# Patient Record
Sex: Female | Born: 1970 | Race: White | Hispanic: No | Marital: Single | Smoking: Never smoker
Health system: Southern US, Community
[De-identification: ages and names within clinical notes are randomized; demographics above are authoritative.]

## PROBLEM LIST (undated history)

## (undated) DIAGNOSIS — F329 Major depressive disorder, single episode, unspecified: Secondary | ICD-10-CM

## (undated) DIAGNOSIS — R112 Nausea with vomiting, unspecified: Secondary | ICD-10-CM

## (undated) DIAGNOSIS — K219 Gastro-esophageal reflux disease without esophagitis: Secondary | ICD-10-CM

## (undated) DIAGNOSIS — D649 Anemia, unspecified: Secondary | ICD-10-CM

## (undated) DIAGNOSIS — R51 Headache: Secondary | ICD-10-CM

## (undated) DIAGNOSIS — F419 Anxiety disorder, unspecified: Secondary | ICD-10-CM

## (undated) DIAGNOSIS — R519 Headache, unspecified: Secondary | ICD-10-CM

## (undated) DIAGNOSIS — Z8489 Family history of other specified conditions: Secondary | ICD-10-CM

## (undated) DIAGNOSIS — Z9889 Other specified postprocedural states: Secondary | ICD-10-CM

## (undated) DIAGNOSIS — C801 Malignant (primary) neoplasm, unspecified: Secondary | ICD-10-CM

## (undated) DIAGNOSIS — F32A Depression, unspecified: Secondary | ICD-10-CM

## (undated) DIAGNOSIS — R011 Cardiac murmur, unspecified: Secondary | ICD-10-CM

## (undated) DIAGNOSIS — N2889 Other specified disorders of kidney and ureter: Secondary | ICD-10-CM

## (undated) HISTORY — PX: GASTRIC ROUX-EN-Y: SHX5262

## (undated) HISTORY — DX: Gastro-esophageal reflux disease without esophagitis: K21.9

## (undated) HISTORY — PX: UVULECTOMY: SHX2631

## (undated) HISTORY — PX: OTHER SURGICAL HISTORY: SHX169

## (undated) HISTORY — PX: TONSILLECTOMY: SUR1361

## (undated) HISTORY — PX: CHOLECYSTECTOMY: SHX55

---

## 2015-12-25 ENCOUNTER — Telehealth: Payer: Self-pay | Admitting: Internal Medicine

## 2015-12-25 ENCOUNTER — Other Ambulatory Visit: Payer: Self-pay | Admitting: Urology

## 2015-12-25 NOTE — Telephone Encounter (Signed)
Received referral to schedule patient an appointment for Gastroparesis. Patient was recently seen by [facility], patient states that [facility] "wouldn't listen to her and blew her off". Patient states that she continues to have nausea, vomiting, and abd cramping. She is requesting to see Dr. Hilarie Fredrickson. [facility] records have been received and placed on Dr. Vena Rua desk for review.

## 2016-01-15 NOTE — Telephone Encounter (Signed)
Records are still being reviewed

## 2016-01-16 ENCOUNTER — Encounter (INDEPENDENT_AMBULATORY_CARE_PROVIDER_SITE_OTHER): Payer: Self-pay | Admitting: Internal Medicine

## 2016-01-28 ENCOUNTER — Ambulatory Visit (INDEPENDENT_AMBULATORY_CARE_PROVIDER_SITE_OTHER): Payer: BLUE CROSS/BLUE SHIELD | Admitting: Internal Medicine

## 2016-01-28 ENCOUNTER — Encounter (INDEPENDENT_AMBULATORY_CARE_PROVIDER_SITE_OTHER): Payer: Self-pay | Admitting: Internal Medicine

## 2016-01-28 VITALS — BP 128/70 | HR 72 | Temp 98.0°F | Ht 64.0 in | Wt 213.6 lb

## 2016-01-28 DIAGNOSIS — K3184 Gastroparesis: Secondary | ICD-10-CM | POA: Diagnosis not present

## 2016-01-28 DIAGNOSIS — K219 Gastro-esophageal reflux disease without esophagitis: Secondary | ICD-10-CM | POA: Insufficient documentation

## 2016-01-28 MED ORDER — OMEPRAZOLE 40 MG PO CPDR: 40.0000 mg | DELAYED_RELEASE_CAPSULE | Freq: Every day | ORAL | 3 refills | Status: DC

## 2016-01-28 NOTE — Patient Instructions (Addendum)
Continue the Reglan qid. Consider following up at [facility] Omeprazole 40mg  daily

## 2016-01-28 NOTE — Telephone Encounter (Signed)
Pt now being seen by [facility], and thus does not need an appt with [place] It appears they have recommended she return to [facility], but will defer this decision to [facility] and the patient No appointment needed with [facility] in light of this information

## 2016-01-28 NOTE — Progress Notes (Signed)
   Subjective:    Patient ID: Ellen Klein, female    DOB: [birth year], 45 y.o.   MRN: [id number]  HPI Referred by Alanda Amass NP-C for dysphagia. She tells me every since she had gastric bypass surgery one year ago she has had nausea about 2-3 times a weeks. She says she has nausea every time she put something in her mouth.  She has been followed by [facility]. She had an EGD in August and she says it was normal. She underwent a NM Gastric Emptying study.: Severely delayed gastric emptying. Half time could not be calculated however only (% of activity emptied from the stomach after 60 minutes. Gastric bypass by Dr Hampton Abbot in [place]. ' She is having surgery November 8 for a possible kidney cancer at [facility].  She has a BM once a week or twice a week. She has had multiple EGD's in the past with ablation for Barrett's (2016) ;10/30/2015 H and H 11.0 and 35.1   10/10/2015 Barium swallow: normal.  No significant abnormality identified.   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 Interval increase in size of right renal lesion which definitely appears to be enhancing and is highly suspicious for primary renal cell carcinoma. Recommend referral to urology. 2. Status post gastric bypass procedure. 3. Large amount of stool throughout the colon suggesting constipation without fecal impaction. 4. Dominant follicles or small cysts left ovary. Recommend follow-up ultrasound of the pelvis to document resolution. 5. Appendix not definitively identified but no inflammatory changes associated with the cecum. 6. Previous cholecystectomy. Dilatation common bile duct, see above.     Review of Systems Past Medical History:  Diagnosis Date  . GERD without esophagitis     Past Surgical History:  Procedure Laterality Date  . rt kidney mass      Allergies  Allergen Reactions  . Imdur [Isosorbide  Dinitrate]     Nausea and vomiting    No current outpatient prescriptions on file prior to visit.   No current facility-administered medications on file prior to visit.        Objective:   Physical Exam Blood pressure 128/70, pulse 72, temperature 98 F (36.7 C), height 5\' 4"  (1.626 m), weight 213 lb 9.6 oz (96.9 kg).   Alert and oriented. Skin warm and dry. Oral mucosa is moist.   . Sclera anicteric, conjunctivae is pink. Thyroid not enlarged. No cervical lymphadenopathy. Lungs clear. Heart regular rate and rhythm.  Abdomen is soft. Bowel sounds are positive. No hepatomegaly. No abdominal masses felt. No tenderness.  No edema to lower extremities. Patient is alert and oriented.      Assessment & Plan:  Nausea. Hx of gastroparesis. Eat 4 small meals a day. Start the Reglan back qid. Omeprazole 40mg  daily.   I think she needs to follow up at [facility].  Follow up pending.

## 2016-01-29 NOTE — Telephone Encounter (Signed)
Dr. Hilarie Fredrickson reviewed records and has declined to accept patient. Patient is aware of this.

## 2016-02-07 NOTE — Patient Instructions (Signed)
Ellen Klein  02/07/2016   Your procedure is scheduled on: 02-13-16  Report to [facility] Main  Entrance take [facility]  elevators to 3rd floor to  [place] at 630 AM.  Call this number if you have problems the morning of surgery [phone]   Remember: ONLY 1 PERSON MAY GO WITH YOU TO SHORT STAY TO GET  READY MORNING OF [place].  Do not eat food or drink liquids :After Midnight.  Follow all bowel prep and clear liquid instructions from dr Louis Meckel   Take these medicines the morning of surgery with A SIP OF WATER: NONE              You may not have any metal on your body including hair pins and              piercings  Do not wear jewelry, make-up, lotions, powders or perfumes, deodorant             Do not wear nail polish.  Do not shave  48 hours prior to surgery.              Men may shave face and neck.   Do not bring valuables to the hospital. [place].  Contacts, dentures or bridgework may not be worn into surgery.  Leave suitcase in the car. After surgery it may be brought to your room.                  Please read over the following fact sheets you were given: _____________________________________________________________________             [facility] - Preparing for Surgery Before surgery, you can play an important role.  Because skin is not sterile, your skin needs to be as free of germs as possible.  You can reduce the number of germs on your skin by washing with CHG (chlorahexidine gluconate) soap before surgery.  CHG is an antiseptic cleaner which kills germs and bonds with the skin to continue killing germs even after washing. Please DO NOT use if you have an allergy to CHG or antibacterial soaps.  If your skin becomes reddened/irritated stop using the CHG and inform your nurse when you arrive at Short Stay. Do not shave (including legs and underarms) for at least 48 hours prior to the  first CHG shower.  You may shave your face/neck. Please follow these instructions carefully:  1.  Shower with CHG Soap the night before surgery and the  morning of Surgery.  2.  If you choose to wash your hair, wash your hair first as usual with your  normal  shampoo.  3.  After you shampoo, rinse your hair and body thoroughly to remove the  shampoo.                           4.  Use CHG as you would any other liquid soap.  You can apply chg directly  to the skin and wash                       Gently with a scrungie or clean washcloth.  5.  Apply the CHG Soap to your body ONLY FROM THE NECK DOWN.  Do not use on face/ open                           Wound or open sores. Avoid contact with eyes, ears mouth and genitals (private parts).                       Wash face,  Genitals (private parts) with your normal soap.             6.  Wash thoroughly, paying special attention to the area where your surgery  will be performed.  7.  Thoroughly rinse your body with warm water from the neck down.  8.  DO NOT shower/wash with your normal soap after using and rinsing off  the CHG Soap.                9.  Pat yourself dry with a clean towel.            10.  Wear clean pajamas.            11.  Place clean sheets on your bed the night of your first shower and do not  sleep with pets. Day of Surgery : Do not apply any lotions/deodorants the morning of surgery.  Please wear clean clothes to the hospital/surgery center.  FAILURE TO FOLLOW THESE INSTRUCTIONS MAY RESULT IN THE CANCELLATION OF YOUR SURGERY PATIENT SIGNATURE_________________________________  NURSE SIGNATURE__________________________________  ________________________________________________________________________

## 2016-02-11 ENCOUNTER — Encounter (HOSPITAL_COMMUNITY)
Admission: RE | Admit: 2016-02-11 | Discharge: 2016-02-11 | Disposition: A | Discharge status: Home / Self Care | Payer: BLUE CROSS/BLUE SHIELD | Source: Ambulatory Visit | Attending: Urology | Admitting: Urology

## 2016-02-11 ENCOUNTER — Encounter (HOSPITAL_COMMUNITY): Payer: Self-pay | Admitting: *Deleted

## 2016-02-11 HISTORY — DX: Other specified disorders of kidney and ureter: N28.89

## 2016-02-11 HISTORY — DX: Headache, unspecified: R51.9

## 2016-02-11 HISTORY — DX: Other specified postprocedural states: Z98.890

## 2016-02-11 HISTORY — DX: Other specified postprocedural states: R11.2

## 2016-02-11 HISTORY — DX: Headache: R51

## 2016-02-11 LAB — CBC
HCT: 31.2 % — ABNORMAL LOW (ref 36.0–46.0)
Hemoglobin: 9.9 g/dL — ABNORMAL LOW (ref 12.0–15.0)
MCH: 26.3 pg (ref 26.0–34.0)
MCHC: 31.7 g/dL (ref 30.0–36.0)
MCV: 83 fL (ref 78.0–100.0)
PLATELETS: 171 10*3/uL (ref 150–400)
RBC: 3.76 MIL/uL — ABNORMAL LOW (ref 3.87–5.11)
RDW: 15.1 % (ref 11.5–15.5)
WBC: 7.4 10*3/uL (ref 4.0–10.5)

## 2016-02-11 LAB — BASIC METABOLIC PANEL
Anion gap: 6 (ref 5–15)
BUN: 15 mg/dL (ref 6–20)
CALCIUM: 8.2 mg/dL — AB (ref 8.9–10.3)
CHLORIDE: 110 mmol/L (ref 101–111)
CO2: 23 mmol/L (ref 22–32)
CREATININE: 0.88 mg/dL (ref 0.44–1.00)
GFR calc Af Amer: >60 mL/min (ref >60)
GFR calc non Af Amer: >60 mL/min (ref >60)
GLUCOSE: 147 mg/dL — AB (ref 65–99)
Potassium: 3.7 mmol/L (ref 3.5–5.1)
Sodium: 139 mmol/L (ref 135–145)

## 2016-02-11 LAB — PREGNANCY, URINE: Preg Test, Ur: NEGATIVE

## 2016-02-11 LAB — ABO/RH: ABO/RH(D): O POS

## 2016-02-12 LAB — URINE CULTURE

## 2016-02-13 ENCOUNTER — Inpatient Hospital Stay (HOSPITAL_COMMUNITY): Payer: BLUE CROSS/BLUE SHIELD | Admitting: Anesthesiology

## 2016-02-13 ENCOUNTER — Inpatient Hospital Stay (HOSPITAL_COMMUNITY)
Admission: RE | Admit: 2016-02-13 | Discharge: 2016-02-14 | DRG: 658 | Disposition: A | Discharge status: Home / Self Care | Payer: BLUE CROSS/BLUE SHIELD | Source: Ambulatory Visit | Attending: Urology | Admitting: Urology

## 2016-02-13 ENCOUNTER — Encounter (HOSPITAL_COMMUNITY): Payer: Self-pay | Admitting: Anesthesiology

## 2016-02-13 ENCOUNTER — Encounter (HOSPITAL_COMMUNITY)
Admission: RE | Disposition: A | Discharge status: Home / Self Care | Payer: Self-pay | Source: Ambulatory Visit | Attending: Urology

## 2016-02-13 DIAGNOSIS — Z888 Allergy status to other drugs, medicaments and biological substances status: Secondary | ICD-10-CM | POA: Diagnosis not present

## 2016-02-13 DIAGNOSIS — D4101 Neoplasm of uncertain behavior of right kidney: Principal | ICD-10-CM | POA: Diagnosis present

## 2016-02-13 DIAGNOSIS — Z6835 Body mass index (BMI) 35.0-35.9, adult: Secondary | ICD-10-CM | POA: Diagnosis not present

## 2016-02-13 DIAGNOSIS — N2889 Other specified disorders of kidney and ureter: Secondary | ICD-10-CM | POA: Diagnosis present

## 2016-02-13 DIAGNOSIS — Z9884 Bariatric surgery status: Secondary | ICD-10-CM

## 2016-02-13 DIAGNOSIS — K219 Gastro-esophageal reflux disease without esophagitis: Secondary | ICD-10-CM | POA: Diagnosis present

## 2016-02-13 DIAGNOSIS — E669 Obesity, unspecified: Secondary | ICD-10-CM | POA: Diagnosis present

## 2016-02-13 HISTORY — PX: ROBOTIC ASSITED PARTIAL NEPHRECTOMY: SHX6087

## 2016-02-13 LAB — HEMOGLOBIN AND HEMATOCRIT, BLOOD
HEMATOCRIT: 30.8 % — AB (ref 36.0–46.0)
HEMOGLOBIN: 10 g/dL — AB (ref 12.0–15.0)

## 2016-02-13 LAB — PREPARE RBC (CROSSMATCH)

## 2016-02-13 SURGERY — NEPHRECTOMY, PARTIAL, ROBOT-ASSISTED
Anesthesia: General | Laterality: Right

## 2016-02-13 MED ORDER — LACTATED RINGERS IV SOLN
INTRAVENOUS | Status: DC | PRN
  Administered 2016-02-13 (×2): via INTRAVENOUS

## 2016-02-13 MED ORDER — DEXTROSE-NACL 5-0.45 % IV SOLN
INTRAVENOUS | Status: DC
  Administered 2016-02-13 (×2): via INTRAVENOUS

## 2016-02-13 MED ORDER — TRAMADOL HCL 50 MG PO TABS: 50.0000 mg | ORAL_TABLET | Freq: Four times a day (QID) | ORAL | 0 refills | Status: DC | PRN

## 2016-02-13 MED ORDER — LIDOCAINE 2% (20 MG/ML) 5 ML SYRINGE
INTRAMUSCULAR | Status: AC
  Filled 2016-02-13: qty 5

## 2016-02-13 MED ORDER — SCOPOLAMINE 1 MG/3DAYS TD PT72
MEDICATED_PATCH | TRANSDERMAL | Status: DC | PRN
  Administered 2016-02-13: 1 via TRANSDERMAL

## 2016-02-13 MED ORDER — MIDAZOLAM HCL 2 MG/2ML IJ SOLN
INTRAMUSCULAR | Status: AC
  Filled 2016-02-13: qty 2

## 2016-02-13 MED ORDER — ROCURONIUM BROMIDE 50 MG/5ML IV SOSY
PREFILLED_SYRINGE | INTRAVENOUS | Status: AC
  Filled 2016-02-13: qty 10

## 2016-02-13 MED ORDER — CEFAZOLIN SODIUM-DEXTROSE 2-4 GM/100ML-% IV SOLN
INTRAVENOUS | Status: AC
  Filled 2016-02-13: qty 100

## 2016-02-13 MED ORDER — BUPIVACAINE LIPOSOME 1.3 % IJ SUSP
INTRAMUSCULAR | Status: DC | PRN
  Administered 2016-02-13: 20 mL

## 2016-02-13 MED ORDER — HYDROCODONE-ACETAMINOPHEN 5-325 MG PO TABS: 1.0000 | ORAL_TABLET | Freq: Four times a day (QID) | ORAL | 0 refills | Status: DC | PRN

## 2016-02-13 MED ORDER — FENTANYL CITRATE (PF) 100 MCG/2ML IJ SOLN
INTRAMUSCULAR | Status: DC | PRN
  Administered 2016-02-13: 50 ug via INTRAVENOUS
  Administered 2016-02-13: 100 ug via INTRAVENOUS
  Administered 2016-02-13 (×2): 50 ug via INTRAVENOUS

## 2016-02-13 MED ORDER — SODIUM CHLORIDE 0.9 % IJ SOLN
INTRAMUSCULAR | Status: AC
Start: 2016-02-13 — End: 2016-02-13
  Filled 2016-02-13: qty 50

## 2016-02-13 MED ORDER — HYDROMORPHONE HCL 1 MG/ML IJ SOLN
0.2500 mg | INTRAMUSCULAR | Status: DC | PRN
Start: 2016-02-13 — End: 2016-02-13
  Administered 2016-02-13: 0.5 mg via INTRAVENOUS

## 2016-02-13 MED ORDER — METOCLOPRAMIDE HCL 5 MG/ML IJ SOLN
INTRAMUSCULAR | Status: DC | PRN
  Administered 2016-02-13: 10 mg via INTRAVENOUS

## 2016-02-13 MED ORDER — GLYCOPYRROLATE 0.2 MG/ML IV SOSY
PREFILLED_SYRINGE | INTRAVENOUS | Status: DC | PRN
  Administered 2016-02-13: .3 mg via INTRAVENOUS

## 2016-02-13 MED ORDER — LIDOCAINE HCL (CARDIAC) 20 MG/ML IV SOLN
INTRAVENOUS | Status: DC | PRN
  Administered 2016-02-13: 50 mg via INTRAVENOUS

## 2016-02-13 MED ORDER — FENTANYL CITRATE (PF) 250 MCG/5ML IJ SOLN
INTRAMUSCULAR | Status: AC
  Filled 2016-02-13: qty 5

## 2016-02-13 MED ORDER — PROPOFOL 10 MG/ML IV BOLUS
INTRAVENOUS | Status: AC
Start: 2016-02-13 — End: 2016-02-13
  Filled 2016-02-13: qty 40

## 2016-02-13 MED ORDER — KETOROLAC TROMETHAMINE 30 MG/ML IJ SOLN
30.0000 mg | Freq: Once | INTRAMUSCULAR | Status: AC
  Administered 2016-02-13: 30 mg via INTRAVENOUS

## 2016-02-13 MED ORDER — DEXAMETHASONE SODIUM PHOSPHATE 10 MG/ML IJ SOLN
INTRAMUSCULAR | Status: DC | PRN
  Administered 2016-02-13: 10 mg via INTRAVENOUS

## 2016-02-13 MED ORDER — ONDANSETRON HCL 4 MG/2ML IJ SOLN
INTRAMUSCULAR | Status: DC | PRN
  Administered 2016-02-13: 4 mg via INTRAVENOUS

## 2016-02-13 MED ORDER — ALBUMIN HUMAN 5 % IV SOLN
INTRAVENOUS | Status: DC | PRN
  Administered 2016-02-13: 10:00:00 via INTRAVENOUS

## 2016-02-13 MED ORDER — MANNITOL 25 % IV SOLN
INTRAVENOUS | Status: AC
  Filled 2016-02-13: qty 100

## 2016-02-13 MED ORDER — SCOPOLAMINE 1 MG/3DAYS TD PT72
MEDICATED_PATCH | TRANSDERMAL | Status: AC
  Filled 2016-02-13: qty 1

## 2016-02-13 MED ORDER — SUGAMMADEX SODIUM 500 MG/5ML IV SOLN
INTRAVENOUS | Status: DC | PRN
  Administered 2016-02-13: 400 mg via INTRAVENOUS

## 2016-02-13 MED ORDER — PROMETHAZINE HCL 25 MG/ML IJ SOLN
INTRAMUSCULAR | Status: AC
  Filled 2016-02-13: qty 1

## 2016-02-13 MED ORDER — PROMETHAZINE HCL 25 MG/ML IJ SOLN
6.2500 mg | INTRAMUSCULAR | Status: DC | PRN
  Administered 2016-02-13: 6.25 mg via INTRAVENOUS

## 2016-02-13 MED ORDER — LACTATED RINGERS IV SOLN
INTRAVENOUS | Status: DC | PRN
  Administered 2016-02-13 (×2): via INTRAVENOUS

## 2016-02-13 MED ORDER — ROCURONIUM BROMIDE 100 MG/10ML IV SOLN
INTRAVENOUS | Status: DC | PRN
  Administered 2016-02-13: 50 mg via INTRAVENOUS
  Administered 2016-02-13 (×5): 10 mg via INTRAVENOUS

## 2016-02-13 MED ORDER — HYDROMORPHONE HCL 1 MG/ML IJ SOLN
0.5000 mg | INTRAMUSCULAR | Status: DC | PRN
  Administered 2016-02-13 (×3): 1 mg via INTRAVENOUS
  Filled 2016-02-13 (×4): qty 1

## 2016-02-13 MED ORDER — PROPOFOL 10 MG/ML IV BOLUS
INTRAVENOUS | Status: DC | PRN
Start: 2016-02-13 — End: 2016-02-13
  Administered 2016-02-13: 200 mg via INTRAVENOUS

## 2016-02-13 MED ORDER — LIDOCAINE 2% (20 MG/ML) 5 ML SYRINGE
INTRAMUSCULAR | Status: AC
Start: 2016-02-13 — End: 2016-02-13
  Filled 2016-02-13: qty 5

## 2016-02-13 MED ORDER — ALBUMIN HUMAN 5 % IV SOLN
INTRAVENOUS | Status: AC
  Filled 2016-02-13: qty 250

## 2016-02-13 MED ORDER — ACETAMINOPHEN 10 MG/ML IV SOLN
1000.0000 mg | Freq: Four times a day (QID) | INTRAVENOUS | Status: AC
  Administered 2016-02-13 – 2016-02-14 (×4): 1000 mg via INTRAVENOUS
  Filled 2016-02-13 (×4): qty 100

## 2016-02-13 MED ORDER — DIPHENHYDRAMINE HCL 50 MG/ML IJ SOLN: 12.5000 mg | Freq: Four times a day (QID) | INTRAMUSCULAR | Status: DC | PRN

## 2016-02-13 MED ORDER — BUPIVACAINE HCL (PF) 0.25 % IJ SOLN
INTRAMUSCULAR | Status: AC
  Filled 2016-02-13: qty 30

## 2016-02-13 MED ORDER — SODIUM CHLORIDE 0.9 % IJ SOLN
INTRAMUSCULAR | Status: AC
  Filled 2016-02-13: qty 10

## 2016-02-13 MED ORDER — BUPIVACAINE HCL (PF) 0.25 % IJ SOLN
INTRAMUSCULAR | Status: DC | PRN
  Administered 2016-02-13: 15 mL

## 2016-02-13 MED ORDER — CEFAZOLIN SODIUM-DEXTROSE 2-4 GM/100ML-% IV SOLN
2.0000 g | INTRAVENOUS | Status: AC
  Administered 2016-02-13: 2 g via INTRAVENOUS

## 2016-02-13 MED ORDER — HYDROMORPHONE HCL 2 MG/ML IJ SOLN
INTRAMUSCULAR | Status: AC
  Filled 2016-02-13: qty 1

## 2016-02-13 MED ORDER — LACTATED RINGERS IR SOLN
Status: DC | PRN
  Administered 2016-02-13: 3000 mL

## 2016-02-13 MED ORDER — MEPERIDINE HCL 50 MG/ML IJ SOLN: 6.2500 mg | INTRAMUSCULAR | Status: DC | PRN

## 2016-02-13 MED ORDER — ROCURONIUM BROMIDE 50 MG/5ML IV SOSY
PREFILLED_SYRINGE | INTRAVENOUS | Status: AC
  Filled 2016-02-13: qty 5

## 2016-02-13 MED ORDER — DIPHENHYDRAMINE HCL 12.5 MG/5ML PO ELIX: 12.5000 mg | ORAL_SOLUTION | Freq: Four times a day (QID) | ORAL | Status: DC | PRN

## 2016-02-13 MED ORDER — HYDROMORPHONE HCL 1 MG/ML IJ SOLN
INTRAMUSCULAR | Status: DC | PRN
  Administered 2016-02-13 (×2): 0.5 mg via INTRAVENOUS

## 2016-02-13 MED ORDER — OXYCODONE HCL 5 MG PO TABS
5.0000 mg | ORAL_TABLET | ORAL | Status: DC | PRN
  Administered 2016-02-14 (×2): 5 mg via ORAL
  Filled 2016-02-13 (×2): qty 1

## 2016-02-13 MED ORDER — KETOROLAC TROMETHAMINE 30 MG/ML IJ SOLN
INTRAMUSCULAR | Status: AC
  Filled 2016-02-13: qty 1

## 2016-02-13 MED ORDER — ONDANSETRON HCL 4 MG/2ML IJ SOLN
4.0000 mg | INTRAMUSCULAR | Status: DC | PRN
  Administered 2016-02-13 – 2016-02-14 (×3): 4 mg via INTRAVENOUS
  Filled 2016-02-13 (×3): qty 2

## 2016-02-13 MED ORDER — SODIUM CHLORIDE 0.9 % IV SOLN: 10.0000 mL/h | Freq: Once | INTRAVENOUS | Status: DC

## 2016-02-13 MED ORDER — BUPIVACAINE LIPOSOME 1.3 % IJ SUSP
INTRAMUSCULAR | Status: AC
  Filled 2016-02-13: qty 20

## 2016-02-13 MED ORDER — HYDROMORPHONE HCL 1 MG/ML IJ SOLN
INTRAMUSCULAR | Status: AC
  Filled 2016-02-13: qty 1

## 2016-02-13 MED ORDER — HEMOSTATIC AGENTS (NO CHARGE) OPTIME
TOPICAL | Status: DC | PRN
  Administered 2016-02-13: 1 via TOPICAL

## 2016-02-13 MED ORDER — MIDAZOLAM HCL 5 MG/5ML IJ SOLN
INTRAMUSCULAR | Status: DC | PRN
  Administered 2016-02-13: 2 mg via INTRAVENOUS

## 2016-02-13 MED ORDER — DEXAMETHASONE SODIUM PHOSPHATE 10 MG/ML IJ SOLN
INTRAMUSCULAR | Status: AC
  Filled 2016-02-13: qty 1

## 2016-02-13 MED ORDER — SUCCINYLCHOLINE CHLORIDE 20 MG/ML IJ SOLN
INTRAMUSCULAR | Status: AC
  Filled 2016-02-13: qty 1

## 2016-02-13 MED ORDER — ONDANSETRON HCL 4 MG/2ML IJ SOLN
INTRAMUSCULAR | Status: AC
  Filled 2016-02-13: qty 2

## 2016-02-13 MED ORDER — SUGAMMADEX SODIUM 200 MG/2ML IV SOLN
INTRAVENOUS | Status: AC
  Filled 2016-02-13: qty 2

## 2016-02-13 MED ORDER — SODIUM CHLORIDE 0.9 % IJ SOLN
INTRAMUSCULAR | Status: DC | PRN
  Administered 2016-02-13: 20 mL

## 2016-02-13 SURGICAL SUPPLY — 67 items
APPLICATOR ARISTA FLEXITIP XL (MISCELLANEOUS) IMPLANT
APPLICATOR SURGIFLO ENDO (HEMOSTASIS) ×3 IMPLANT
CATH FOLEY 2WAY SLVR  5CC 14FR (CATHETERS) ×2
CATH FOLEY 2WAY SLVR 5CC 14FR (CATHETERS) ×1 IMPLANT
CHLORAPREP W/TINT 26ML (MISCELLANEOUS) ×3 IMPLANT
CLIP LIGATING HEM O LOK PURPLE (MISCELLANEOUS) ×3 IMPLANT
CLIP LIGATING HEMO LOK XL GOLD (MISCELLANEOUS) IMPLANT
CLIP LIGATING HEMO O LOK GREEN (MISCELLANEOUS) ×3 IMPLANT
COVER SURGICAL LIGHT HANDLE (MISCELLANEOUS) ×3 IMPLANT
COVER TIP SHEARS 8 DVNC (MISCELLANEOUS) ×1 IMPLANT
COVER TIP SHEARS 8MM DA VINCI (MISCELLANEOUS) ×2
DECANTER SPIKE VIAL GLASS SM (MISCELLANEOUS) ×3 IMPLANT
DERMABOND ADVANCED (GAUZE/BANDAGES/DRESSINGS) ×2
DERMABOND ADVANCED .7 DNX12 (GAUZE/BANDAGES/DRESSINGS) ×1 IMPLANT
DRAIN CHANNEL 15F RND FF 3/16 (WOUND CARE) ×3 IMPLANT
DRAPE ARM DVNC X/XI (DISPOSABLE) ×4 IMPLANT
DRAPE COLUMN DVNC XI (DISPOSABLE) ×1 IMPLANT
DRAPE DA VINCI XI ARM (DISPOSABLE) ×8
DRAPE DA VINCI XI COLUMN (DISPOSABLE) ×2
DRAPE INCISE IOBAN 66X45 STRL (DRAPES) ×3 IMPLANT
DRAPE LAPAROSCOPIC ABDOMINAL (DRAPES) ×3 IMPLANT
DRAPE SHEET LG 3/4 BI-LAMINATE (DRAPES) ×3 IMPLANT
DRSG TEGADERM 4X4.75 (GAUZE/BANDAGES/DRESSINGS) ×3 IMPLANT
ELECT PENCIL ROCKER SW 15FT (MISCELLANEOUS) ×3 IMPLANT
ELECT REM PT RETURN 9FT ADLT (ELECTROSURGICAL) ×3
ELECTRODE REM PT RTRN 9FT ADLT (ELECTROSURGICAL) ×1 IMPLANT
EVACUATOR SILICONE 100CC (DRAIN) ×3 IMPLANT
GLOVE BIO SURGEON STRL SZ 6.5 (GLOVE) ×2 IMPLANT
GLOVE BIO SURGEONS STRL SZ 6.5 (GLOVE) ×1
GLOVE BIOGEL M STRL SZ7.5 (GLOVE) ×9 IMPLANT
GOWN STRL REUS W/TWL LRG LVL3 (GOWN DISPOSABLE) ×15 IMPLANT
HEMOSTAT ARISTA ABSORB 3G PWDR (MISCELLANEOUS) IMPLANT
IRRIG SUCT STRYKERFLOW 2 WTIP (MISCELLANEOUS) ×3
IRRIGATION SUCT STRKRFLW 2 WTP (MISCELLANEOUS) ×1 IMPLANT
KIT BASIN OR (CUSTOM PROCEDURE TRAY) ×3 IMPLANT
LOOP VESSEL MAXI BLUE (MISCELLANEOUS) IMPLANT
MARKER SKIN DUAL TIP RULER LAB (MISCELLANEOUS) ×3 IMPLANT
NS IRRIG 1000ML POUR BTL (IV SOLUTION) IMPLANT
PORT ACCESS TROCAR AIRSEAL 12 (TROCAR) ×1 IMPLANT
PORT ACCESS TROCAR AIRSEAL 5M (TROCAR) ×2
POSITIONER SURGICAL ARM (MISCELLANEOUS) ×6 IMPLANT
POUCH SPECIMEN RETRIEVAL 10MM (ENDOMECHANICALS) ×3 IMPLANT
SEAL CANN UNIV 5-8 DVNC XI (MISCELLANEOUS) ×4 IMPLANT
SEAL XI 5MM-8MM UNIVERSAL (MISCELLANEOUS) ×8
SET TRI-LUMEN FLTR TB AIRSEAL (TUBING) ×3 IMPLANT
SOLUTION ELECTROLUBE (MISCELLANEOUS) ×3 IMPLANT
SPOGE SURGIFLO 8M (HEMOSTASIS)
SPONGE SURGIFLO 8M (HEMOSTASIS) IMPLANT
SURGIFLO W/THROMBIN 8M KIT (HEMOSTASIS) ×3 IMPLANT
SUT ETHILON 3 0 PS 1 (SUTURE) ×3 IMPLANT
SUT MNCRL AB 4-0 PS2 18 (SUTURE) ×6 IMPLANT
SUT V-LOC BARB 180 2/0GR6 GS22 (SUTURE) ×3
SUT VIC AB 0 CT1 27 (SUTURE) ×2
SUT VIC AB 0 CT1 27XBRD ANTBC (SUTURE) ×1 IMPLANT
SUT VICRYL 0 UR6 27IN ABS (SUTURE) ×3 IMPLANT
SUT VLOC BARB 180 ABS3/0GR12 (SUTURE)
SUTURE V-LC BRB 180 2/0GR6GS22 (SUTURE) ×1 IMPLANT
SUTURE VLOC BRB 180 ABS3/0GR12 (SUTURE) IMPLANT
TAPE STRIPS DRAPE STRL (GAUZE/BANDAGES/DRESSINGS) IMPLANT
TOWEL OR 17X26 10 PK STRL BLUE (TOWEL DISPOSABLE) ×3 IMPLANT
TOWEL OR NON WOVEN STRL DISP B (DISPOSABLE) ×3 IMPLANT
TRAY FOLEY CATH SILVER 14FR (SET/KITS/TRAYS/PACK) ×3 IMPLANT
TRAY FOLEY W/METER SILVER 16FR (SET/KITS/TRAYS/PACK) IMPLANT
TRAY LAPAROSCOPIC (CUSTOM PROCEDURE TRAY) ×3 IMPLANT
TROCAR XCEL 12X100 BLDLESS (ENDOMECHANICALS) IMPLANT
WATER STERILE IRR 1000ML POUR (IV SOLUTION) ×3 IMPLANT
WATER STERILE IRR 1500ML POUR (IV SOLUTION) IMPLANT

## 2016-02-13 NOTE — Progress Notes (Signed)
Lab results noted- Hgb. 10.0- Hct. 30.8

## 2016-02-13 NOTE — Progress Notes (Signed)
Hgb. And Hct. Drawn by lab. 

## 2016-02-13 NOTE — Anesthesia Postprocedure Evaluation (Signed)
Anesthesia Post Note  Patient: Ellen Klein  Procedure(s) Performed: Procedure(s) (LRB): XI ROBOTIC ASSITED PARTIAL NEPHRECTOMY (Right)  Patient location during evaluation: PACU Anesthesia Type: General Level of consciousness: awake and sedated Pain management: pain level controlled Vital Signs Assessment: post-procedure vital signs reviewed and stable Respiratory status: spontaneous breathing Cardiovascular status: stable Postop Assessment: no signs of nausea or vomiting Anesthetic complications: no     Last Vitals:  Vitals:   02/13/16 1330 02/13/16 1345  BP: (!) 144/74 140/72  Pulse: (!) 56 (!) 55  Resp: 10 12  Temp: 37 C     Last Pain:  Vitals:   02/13/16 1330  TempSrc:   PainSc: 5    Pain Goal: Patients Stated Pain Goal: 4 (02/13/16 0649)               Sylis Ketchum JR,JOHN Mateo Flow

## 2016-02-13 NOTE — Anesthesia Procedure Notes (Signed)
Procedure Name: Intubation Date/Time: 02/13/2016 9:00 AM Performed by: Diem Dicocco, Virgel Gess Pre-anesthesia Checklist: Patient identified, Emergency Drugs available, Suction available, Patient being monitored and Timeout performed Patient Re-evaluated:Patient Re-evaluated prior to inductionOxygen Delivery Method: Circle system utilized Preoxygenation: Pre-oxygenation with 100% oxygen Intubation Type: IV induction Ventilation: Mask ventilation without difficulty Laryngoscope Size: Mac and 4 Grade View: Grade I Tube type: Oral Tube size: 7.5 mm Number of attempts: 1 Airway Equipment and Method: Stylet Placement Confirmation: ETT inserted through vocal cords under direct vision,  positive ETCO2,  CO2 detector and breath sounds checked- equal and bilateral Secured at: 22 cm Tube secured with: Tape Dental Injury: Teeth and Oropharynx as per pre-operative assessment

## 2016-02-13 NOTE — Discharge Instructions (Signed)

## 2016-02-13 NOTE — Op Note (Signed)
Pre-operative diagnosis: Right renal mass  Post-operative diagnosis: Same  Procedure performed:  1. Robot-assisted right laparoscopic partial nephrectomy 2. Intraoperative ultrasound with interpretation   Surgeon: Ardis Hughs, MD  Assistant: Debbrah Alar, PA-C  Resident: Lorayne Bender, MD  Anesthesia: General  Complications: None  Drains: Foley, JP drain  Specimens: Right renal mass  Findings: 1. Hilum: 1 artery, 1 vein.  2. Intraoperative ultrasound revealed right upper pole posterior renal mass, almost completely endophytic. 3. Specimen was reviewed grossly by pathology prior to case end who confirmed intact mass and grossly clear margins 4. Warm ischemia time = 17 minutes.  EBL: Approximately 50cc  Indication: Ellen Klein is a 45 y.o. patient with a right renal mass concerning for carcinoma that grew on interval imaging. After reviewing the management options for treatment, she elected to proceed with the above surgical procedure(s). We have discussed the potential benefits and risks of the procedure, side effects of the proposed treatment, the likelihood of the patient achieving the goals of the procedure, and any potential problems that might occur during the procedure or recuperation. Informed consent has been obtained.  Description of procedure: An assistant was required for this surgical procedure.  The duties of the assistant included but were not limited to suctioning, passing suture, camera manipulation, retraction. This procedure would not be able to be performed without an Environmental consultant.  The patient was taken to the operating room and a general anesthetic was administered. The patient was given preoperative antibiotics, placed in the right modified flank position with care to pad all potential pressure points, and prepped and draped in the usual sterile fashion. Next a preoperative timeout was performed.  A site was selected on the right side of the umbilicus  for placement of the camera port. This was placed using a standard modified Hassan technique with entry into the peritoneum with a 10 mm 0 deg laparoscope with a visual obturator. We entered the peritoneum without incident and established pneumoperitoneum. The camera was then used to inspect the abdomen and there was no evidence of any intra-abdominal injuries or other abnormalities. The remaining abdominal ports were then placed. 8 mm robotic ports were placed in the right upper quadrant, right lower quadrant, and far right lateral abdominal wall. A 12 mm port was placed in the upper midline for laparoscopic assistance. Lysis of adhesions were performed at the midline to allow placement of the 12 mm assistant port. All ports were placed under direct vision without difficulty. The surgical cart was then docked.   Utilizing the cautery scissors, the white line of Toldt was incised allowing the colon to be mobilized medially and the plane between the mesocolon and the anterior layer of Gerota's fascia to be developed and the kidney to be exposed. The ureter and gonadal vein were identified inferiorly and the ureter was lifted anteriorly off the psoas muscle. Dissection proceeded superiorly along the gonadal vein until the renal vein was identified. The renal hilum was then carefully isolated with a combination of blunt and sharp dissection allowing the renal arterial and venous structures to be separated and isolated in preparation for renal hilar vessel clamping.  Attention was turned to the kidney and the perinephric fat surrounding the renal mass was removed and the kidney was mobilized sufficiently for exposure and resection of the renal mass.We used intraoperative ultrasound to mark out the boundaries of the mass.   Once the renal mass was properly isolated and marked out, preparations were made for resection of the tumor.  The renal artery was then clamped with a bulldog clamp. The tumor was then  excised with cold scissor dissection along with an adequate visible gross margin of normal renal parenchyma. The tumor appeared to be excised without any gross violation of the tumor. The renal collecting system was not entered during removal of the tumor. A running horizontal mattress 3-0 V-lock suture was then brought through the capsule of the kidney and run along the base of the renal defect to provide hemostasis and close any entry into the renal collecting system if present. Weck clips were used to secure this suture outside the renal capsule at the proximal and distal ends. A running 2-0 V lock suture was then used to close the capsule of the kidney using a sliding clip technique which resulted in excellent hemostasis. The bulldog clamp was then removed from the renal artery. An additional hemostatic agent (Surgiflo) was then placed into the renal defect.   Total warm renal ischemia time was 17 minutes. The renal tumor resection site was examined. Hemostasis appeared adequate.   The kidney was placed back into its normal anatomic position and covered with perinephric fat using Weck clips. A # 68 Blake drain was then brought through the lateral lower port site and positioned in the perinephric space. It was secured to the skin with a nylon suture. The surgical robotic cart was undocked. The renal tumor specimen was removed intact within an endopouch retrieval bag via the camera port sites. The camera port site and the other 12 mm port site were then closed at the fascial level with 0-vicryl suture. All other laparoscopic/robotic ports were removed under direct vision and the pneumoperitoneum let down with inspection of the operative field performed and hemostasis again confirmed. All incision sites were then injected with local anesthetic and reapproximated at the skin level with 4-0 monocryl subcuticular closures. Dermabond was applied to the skin. The patient tolerated the procedure well and  without complications. The patient was able to be extubated and transferred to the recovery unit in satisfactory condition.

## 2016-02-13 NOTE — H&P (Signed)
Renal Mass Surveillance  HPI: Ellen Klein is a 45 year-old female established patient who is here ongoing eval and management of a renal mass.  The mass is on the right side.   The lesion(s) was first noted on 09/04/2015. The mass was seen on CT Scan.   The mass was last imaged 4 weeks. The patient recently underwent a ct scan to re-evaluate the lesion in question. . It has grown slight progression (1.4 ->1.8cm in 8 months).   She has had no symptoms. She has not seen blood in her urine. She does have a good appetite. She is having pain in new locations. The pain is located in the right lower quadrant. Patient denies epigastric region, left lower quadrant, right upper quadrant, left upper quadrant, and suprapubic area. She has not recently had unwanted weight loss.   She has had previous abdominal surgery. Her past surgical history includes: Gastric Bypass. The patient can walk a flight of steps.   The patient denies history of diabetes, heart attack or stroke. There is not a a family history of kidney cancer. There is no family history of brain tumors (AMLs), seizures or brain aneurysm's.   CT scans done at [facility].   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  Social Drinker.  Drinks 1 caffeinated drink per day.    REVIEW OF SYSTEMS:    GU Review Female:   Patient denies frequent urination, hard to postpone urination, burning /pain with urination, get up at night to urinate, leakage of urine, stream starts and stops, trouble starting your stream, have to  strain to urinate, and currently pregnant.  Gastrointestinal (Upper):   Patient denies nausea, vomiting, and indigestion/ heartburn.  Gastrointestinal (Lower):   Patient denies diarrhea and constipation.  Constitutional:   Patient denies fever, night sweats, weight loss, and fatigue.  Skin:   Patient denies skin rash/ lesion and itching.  Eyes:   Patient denies blurred vision and double vision.  Ears/ Nose/ Throat:   Patient denies sore throat and sinus problems.  Hematologic/Lymphatic:   Patient denies swollen glands and easy bruising.  Cardiovascular:   Patient denies leg swelling and chest pains.  Respiratory:   Patient denies cough and shortness of breath.  Endocrine:   Patient denies excessive thirst.  Musculoskeletal:   Patient denies back pain and joint pain.  Neurological:   Patient denies headaches and dizziness.  Psychologic:   Patient denies depression and anxiety.   VITAL SIGNS:      12/20/2015 01:18 PM  Weight 204 lb / 92.53 kg  Height 64 in / 162.56 cm  BMI 35.0 kg/m   MULTI-SYSTEM PHYSICAL EXAMINATION:    Constitutional: Well-nourished. No physical deformities. Normally developed. Good grooming.  Neck: Neck symmetrical, not swollen. Normal tracheal position.  Respiratory: No labored breathing, no use of accessory muscles. CTA  Cardiovascular: Normal temperature, normal extremity pulses, no swelling, no varicosities. RRR  Lymphatic: No enlargement of neck, axillae, groin.  Skin: No paleness, no jaundice, no cyanosis. No lesion, no ulcer, no rash.  Neurologic / Psychiatric: Oriented to time, oriented to place, oriented to person. No depression, no anxiety, no agitation.  Gastrointestinal: No mass, no tenderness, no rigidity, non obese abdomen.  Eyes: Normal conjunctivae. Normal eyelids.  Ears, Nose, Mouth, and Throat: Left ear no scars, no lesions, no masses. Right ear no scars, no lesions, no masses. Nose no scars, no lesions, no masses. Normal hearing. Normal lips.   Musculoskeletal: Normal gait and station of head and neck.     PAST DATA REVIEWED:  Source Of History:  Patient  Records Review:   Previous Patient Records  X-Ray Review: C.T. Abdomen/Pelvis: Reviewed Films. Discussed With Patient. The patient has a completely endophytic 2.2 cm 1.8 cm right upper pole posterior renal mass with enhancement concerning for renal cell carcinoma. The patient has a single artery with an early branch to the upper pole. She has a single vein.    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 The patient has a single artery with an early branch to the upper pole. She has a single vein.   PLAN:           Document Letter(s):  Created for Patient: Clinical Summary    The patient has been given the natural history of renal cancer, treatment options, and I recommended surgical extirpation for this patient. I went over the robotic-assisted laparoscopic partial nephrectomy approach. I described for the patient the procedure in detail including port placement. I detailed the postoperative course including the fact that the patient would have both a drain and a Foley catheter following the surgery. I told the patient that most often patients are discharged on postoperative day one or 2. I then detailed the expected recovery time, I told the patient that he would not be able to lift anything greater than 20 pounds for 4 weeks. I also went over the risks and benefits of this operation in great detail. We discussed the risk of injury to surrounding structures, major blood vessels and nerves, bleeding, infection, loss of kidney, and the risk of recurrent cancer.         Notes:   The patient's tumor is completely endophytic and posterior in the upper pole of the right kidney. She  has an early branching renal artery to the upper pole. This will be a challenging case given her previous Roux-en-Y laparoscopic surgery and the location of the tumor.

## 2016-02-13 NOTE — Transfer of Care (Signed)
Immediate Anesthesia Transfer of Care Note  Patient: Ellen Klein  Procedure(s) Performed: Procedure(s): XI ROBOTIC ASSITED PARTIAL NEPHRECTOMY (Right)  Patient Location: PACU  Anesthesia Type:General  Level of Consciousness:  sedated, patient cooperative and responds to stimulation  Airway & Oxygen Therapy:Patient Spontanous Breathing and Patient connected to face mask oxgen  Post-op Assessment:  Report given to PACU RN and Post -op Vital signs reviewed and stable  Post vital signs:  Reviewed and stable  Last Vitals:  Vitals:   02/13/16 0650  BP: (!) 142/83  Pulse: (!) 58  Resp: 18  Temp: [id number] C    Complications: No apparent anesthesia complications

## 2016-02-13 NOTE — Anesthesia Preprocedure Evaluation (Addendum)
Anesthesia Evaluation  Patient identified by MRN, date of birth, ID band Patient awake    Reviewed: Allergy & Precautions, H&P , NPO status , Patient's Chart, lab work & pertinent test results  History of Anesthesia Complications (+) PONV and history of anesthetic complications  Airway Mallampati: I  TM Distance: >3 FB Neck ROM: full    Dental no notable dental hx. (+) Teeth Intact   Pulmonary neg pulmonary ROS,    Pulmonary exam normal        Cardiovascular negative cardio ROS Normal cardiovascular exam     Neuro/Psych negative psych ROS   GI/Hepatic Neg liver ROS, Controlled,  Endo/Other  negative endocrine ROS  Renal/GU negative Renal ROS     Musculoskeletal   Abdominal (+) + obese,   Peds  Hematology negative hematology ROS (+)   Anesthesia Other Findings   Reproductive/Obstetrics negative OB ROS                             Anesthesia Physical Anesthesia Plan  ASA: II  Anesthesia Plan: General   Post-op Pain Management:    Induction: Intravenous  Airway Management Planned: Oral ETT  Additional Equipment:   Intra-op Plan:   Post-operative Plan: Extubation in OR  Informed Consent: I have reviewed the patients History and Physical, chart, labs and discussed the procedure including the risks, benefits and alternatives for the proposed anesthesia with the patient or authorized representative who has indicated his/her understanding and acceptance.   Dental advisory given  Plan Discussed with: CRNA and Surgeon  Anesthesia Plan Comments:        Anesthesia Quick Evaluation

## 2016-02-14 LAB — HEMOGLOBIN AND HEMATOCRIT, BLOOD
HEMATOCRIT: 28.9 % — AB (ref 36.0–46.0)
HEMOGLOBIN: 9.1 g/dL — AB (ref 12.0–15.0)

## 2016-02-14 LAB — BASIC METABOLIC PANEL
ANION GAP: 7 (ref 5–15)
BUN: 11 mg/dL (ref 6–20)
CALCIUM: 8.3 mg/dL — AB (ref 8.9–10.3)
CO2: 23 mmol/L (ref 22–32)
Chloride: 108 mmol/L (ref 101–111)
Creatinine, Ser: 1.2 mg/dL — ABNORMAL HIGH (ref 0.44–1.00)
GFR, EST NON AFRICAN AMERICAN: 53 mL/min — AB (ref >60)
Glucose, Bld: 153 mg/dL — ABNORMAL HIGH (ref 65–99)
POTASSIUM: 4.1 mmol/L (ref 3.5–5.1)
SODIUM: 138 mmol/L (ref 135–145)

## 2016-02-14 LAB — CREATININE, FLUID (PLEURAL, PERITONEAL, JP DRAINAGE): Creat, Fluid: 1.2 mg/dL

## 2016-02-14 NOTE — Discharge Summary (Signed)
Date of admission: 02/13/2016  Date of discharge: 02/14/2016  Admission diagnosis: Right renal mass  Discharge diagnosis: Same  Secondary diagnoses:  Patient Active Problem List   Diagnosis Date Noted  . Renal mass 02/13/2016  . GERD (gastroesophageal reflux disease) 01/28/2016    History and Physical: For full details, please see admission history and physical. Briefly, Ellen Klein is a 45 y.o. year old patient with a right renal mass who elected to proceed with robotic right partial nephrectomy by Dr. Louis Meckel on 02/13/16.   Hospital Course: Patient tolerated the procedure well.  She was then transferred to the floor after an uneventful PACU stay.  Her hospital course was uncomplicated.  On POD#1 she had met discharge criteria: was eating a regular diet, was up and ambulating independently,  pain was well controlled, was voiding without a catheter, and was ready to for discharge. JP creatinine checked prior to discharge and was consistent with serum. JP drain removed prior to discharge.   Laboratory values:   Recent Labs  02/11/16 1530 02/13/16 1306 02/14/16 0455  WBC 7.4  --   --   HGB 9.9* 10.0* 9.1*  HCT 31.2* 30.8* 28.9*    Recent Labs  02/11/16 1530 02/14/16 0455  NA 139 138  K 3.7 4.1  CL 110 108  CO2 23 23  GLUCOSE 147* 153*  BUN 15 11  CREATININE 0.88 1.20*  CALCIUM 8.2* 8.3*   No results for input(s): LABPT, INR in the last 72 hours. No results for input(s): LABURIN in the last 72 hours. Results for orders placed or performed during the hospital encounter of 02/11/16  Urine culture     Status: Abnormal   Collection Time: 02/11/16  2:20 PM  Result Value Ref Range Status   Specimen Description URINE, RANDOM  Final   Special Requests NONE  Final   Culture MULTIPLE SPECIES PRESENT, SUGGEST RECOLLECTION (A)  Final   Report Status 02/12/2016 FINAL  Final    Disposition: Home  Discharge instruction: The patient was instructed to be ambulatory but told to  refrain from heavy lifting, strenuous activity for 4-6 weeks. No driving while taking pain medication.    Discharge medications:    Medication List    TAKE these medications   traMADol 50 MG tablet Commonly known as:  ULTRAM Take 1-2 tablets (50-100 mg total) by mouth every 6 (six) hours as needed for moderate pain or severe pain.       Followup:  Follow-up Information    Ardis Hughs, MD Follow up on 02/19/2016.   Specialty:  Urology Why:  at 9:15 Contact information: [address] [place] [id number] [phone]

## 2016-02-14 NOTE — Care Management Note (Signed)
Case Management Note  Patient Details  Name: Ellen Klein MRN: [id number] Date of Birth: Apr 10, 1970  Subjective/Objective:45 y/o f admitted w/R renal mass.s/p partial nephrectomy. From home.                    Action/Plan:d/c home.   Expected Discharge Date:                  Expected Discharge Plan:  Home/Self Care  In-House Referral:     Discharge planning Services  CM Consult  Post Acute Care Choice:    Choice offered to:     DME Arranged:    DME Agency:     HH Arranged:    [facility] Agency:     Status of Service:  Completed, signed off  If discussed at [employer] of Stay Meetings, dates discussed:    Additional Comments:  Dessa Phi, RN 02/14/2016, 12:00 PM

## 2016-02-14 NOTE — Progress Notes (Signed)
Urology Inpatient Progress Report  RIGHT RENAL MASS  Procedure(s): XI ROBOTIC ASSITED PARTIAL NEPHRECTOMY  1 Day Post-Op   Intv/Subj: No acute events overnight. Patient c/o of right thoracic/rib pain - tender to palpation No abdominal pain, mild nausea  Active Problems:   Renal mass  Current Facility-Administered Medications  Medication Dose Route Frequency Provider Last Rate Last Dose  . 0.9 %  sodium chloride infusion  10 mL/hr Intravenous Once [facility], CRNA      . acetaminophen (OFIRMEV) IV 1,000 mg  1,000 mg Intravenous Q6H Amanda Dancy, PA-C   1,000 mg at 02/14/16 0526  . dextrose 5 %-0.45 % sodium chloride infusion   Intravenous Continuous Debbrah Alar, PA-C 125 mL/hr at 02/13/16 2348    . diphenhydrAMINE (BENADRYL) injection 12.5-25 mg  12.5-25 mg Intravenous Q6H PRN Debbrah Alar, PA-C       Or  . diphenhydrAMINE (BENADRYL) 12.5 MG/5ML elixir 12.5-25 mg  12.5-25 mg Oral Q6H PRN Debbrah Alar, PA-C      . HYDROmorphone (DILAUDID) injection 0.5-1 mg  0.5-1 mg Intravenous Q2H PRN Debbrah Alar, PA-C   1 mg at 02/13/16 2143  . ondansetron (ZOFRAN) injection 4 mg  4 mg Intravenous Q4H PRN Debbrah Alar, PA-C   4 mg at 02/13/16 1715  . oxyCODONE (Oxy IR/ROXICODONE) immediate release tablet 5 mg  5 mg Oral Q4H PRN Debbrah Alar, PA-C   5 mg at 02/14/16 0212     Objective: Vital: Vitals:   02/13/16 2159 02/14/16 0201 02/14/16 0207 02/14/16 0346  BP: (!) 132/51 (!) 124/53    Pulse: (!) 47 (!) 48 (!) 48 (!) 54  Resp: 12 16    Temp: 98.2 F (36.8 C) 98.1 F (36.7 C)    TempSrc: Oral Oral    SpO2: 100% 100%    Weight:      Height:       I/Os: I/O last 3 completed shifts: In: 3294.2 [P.O.:240; I.V.:2704.2; IV Piggyback:350] Out: 676 [Urine:475; Emesis/NG output:1; Drains:150; Blood:50]  Physical Exam:  General: Patient is in no apparent distress Lungs: Normal respiratory effort, chest expands symmetrically. GI: Incisions are c/d/i. The abdomen is soft and  appropriately tender JP drain with serosanguinous drainage Foley: out  Ext: lower extremities symmetric  Lab Results:  Recent Labs  02/11/16 1530 02/13/16 1306 02/14/16 0455  WBC 7.4  --   --   HGB 9.9* 10.0* 9.1*  HCT 31.2* 30.8* 28.9*    Recent Labs  02/11/16 1530 02/14/16 0455  NA 139 138  K 3.7 4.1  CL 110 108  CO2 23 23  GLUCOSE 147* 153*  BUN 15 11  CREATININE 0.88 1.20*  CALCIUM 8.2* 8.3*   No results for input(s): LABPT, INR in the last 72 hours. No results for input(s): LABURIN in the last 72 hours. Results for orders placed or performed during the hospital encounter of 02/11/16  Urine culture     Status: Abnormal   Collection Time: 02/11/16  2:20 PM  Result Value Ref Range Status   Specimen Description URINE, RANDOM  Final   Special Requests NONE  Final   Culture MULTIPLE SPECIES PRESENT, SUGGEST RECOLLECTION (A)  Final   Report Status 02/12/2016 FINAL  Final    Studies/Results: No results found.  Assessment: Procedure(s): XI ROBOTIC ASSITED PARTIAL NEPHRECTOMY, 1 Day Post-Op  doing well.  Plan: HLIVF JP for creatinine - d/c prior to discharge if able Transition to oral pain meds Walk ADAT Reassess this AM for discharge.   Louis Meckel,  MD Urology 02/14/2016, 5:41 AM

## 2016-02-14 NOTE — Progress Notes (Signed)
DC instructions reviewed with the pt and her family at the bedside. All questions and concerns were addressed with pt. JP drain removed, pt tolerated well, site is unremarkable, serosanguinous fluid emptied before pulling. All other incision sites unremarkable and all other skin intact. Pt pain is controlled at this time, VSS, no apparent distress or discomfort at this time. Pt will eat lunch and then DC home with family. Will continue to monitor until pt DC from unit.

## 2016-02-17 LAB — TYPE AND SCREEN
ABO/RH(D): O POS
Antibody Screen: NEGATIVE
UNIT DIVISION: 0
Unit division: 0

## 2016-02-22 ENCOUNTER — Encounter (INDEPENDENT_AMBULATORY_CARE_PROVIDER_SITE_OTHER): Payer: Self-pay

## 2016-07-31 ENCOUNTER — Ambulatory Visit (HOSPITAL_COMMUNITY)
Admission: RE | Admit: 2016-07-31 | Discharge: 2016-07-31 | Disposition: A | Discharge status: Home / Self Care | Payer: BLUE CROSS/BLUE SHIELD | Source: Ambulatory Visit | Attending: Urology | Admitting: Urology

## 2016-07-31 ENCOUNTER — Other Ambulatory Visit: Payer: Self-pay | Admitting: Urology

## 2016-07-31 DIAGNOSIS — C641 Malignant neoplasm of right kidney, except renal pelvis: Secondary | ICD-10-CM

## 2016-08-12 ENCOUNTER — Encounter (INDEPENDENT_AMBULATORY_CARE_PROVIDER_SITE_OTHER): Payer: Self-pay | Admitting: Internal Medicine

## 2016-09-12 ENCOUNTER — Telehealth (HOSPITAL_COMMUNITY): Payer: Self-pay | Admitting: *Deleted

## 2016-09-12 NOTE — Telephone Encounter (Signed)
PATIENT WILL CALL BACK WITH INSURANCE INFORMATION ON MONDAY.

## 2016-09-16 ENCOUNTER — Other Ambulatory Visit: Payer: Self-pay | Admitting: Urology

## 2016-09-16 DIAGNOSIS — C641 Malignant neoplasm of right kidney, except renal pelvis: Secondary | ICD-10-CM

## 2016-09-25 ENCOUNTER — Telehealth (HOSPITAL_COMMUNITY): Payer: Self-pay | Admitting: *Deleted

## 2016-09-25 NOTE — Telephone Encounter (Signed)
returned phone call to patient, she left voice message.
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
## 2016-09-30 ENCOUNTER — Encounter (HOSPITAL_COMMUNITY): Payer: Self-pay | Admitting: Psychiatry

## 2016-09-30 ENCOUNTER — Ambulatory Visit (INDEPENDENT_AMBULATORY_CARE_PROVIDER_SITE_OTHER): Payer: 59 | Admitting: Psychiatry

## 2016-09-30 VITALS — BP 116/63 | HR 58 | Ht 64.0 in | Wt 211.4 lb

## 2016-09-30 DIAGNOSIS — G4700 Insomnia, unspecified: Secondary | ICD-10-CM | POA: Diagnosis not present

## 2016-09-30 DIAGNOSIS — F331 Major depressive disorder, recurrent, moderate: Secondary | ICD-10-CM

## 2016-09-30 DIAGNOSIS — Z9884 Bariatric surgery status: Secondary | ICD-10-CM | POA: Diagnosis not present

## 2016-09-30 DIAGNOSIS — K219 Gastro-esophageal reflux disease without esophagitis: Secondary | ICD-10-CM | POA: Diagnosis not present

## 2016-09-30 DIAGNOSIS — I10 Essential (primary) hypertension: Secondary | ICD-10-CM | POA: Diagnosis not present

## 2016-09-30 DIAGNOSIS — R45851 Suicidal ideations: Secondary | ICD-10-CM | POA: Diagnosis not present

## 2016-09-30 MED ORDER — DULOXETINE HCL 40 MG PO CPEP
40.0000 mg | ORAL_CAPSULE | Freq: Every day | ORAL | 1 refills | Status: DC
Start: 2016-09-30 — End: 2016-10-29

## 2016-09-30 NOTE — Patient Instructions (Addendum)
1. Increase duloxetine 40 mg daily 2. Return to clinic in one month for 30 mins

## 2016-10-23 ENCOUNTER — Ambulatory Visit (HOSPITAL_COMMUNITY)
Admission: RE | Admit: 2016-10-23 | Discharge: 2016-10-23 | Disposition: A | Discharge status: Home / Self Care | Payer: Managed Care, Other (non HMO) | Source: Ambulatory Visit | Attending: Urology | Admitting: Urology

## 2016-10-23 DIAGNOSIS — C641 Malignant neoplasm of right kidney, except renal pelvis: Secondary | ICD-10-CM | POA: Diagnosis present

## 2016-10-23 DIAGNOSIS — Z905 Acquired absence of kidney: Secondary | ICD-10-CM | POA: Insufficient documentation

## 2016-10-23 LAB — POCT I-STAT CREATININE: CREATININE: 0.8 mg/dL (ref 0.44–1.00)

## 2016-10-23 MED ORDER — GADOBENATE DIMEGLUMINE 529 MG/ML IV SOLN
20.0000 mL | Freq: Once | INTRAVENOUS | Status: AC | PRN
  Administered 2016-10-23: 20 mL via INTRAVENOUS
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
## 2016-10-29 ENCOUNTER — Encounter (HOSPITAL_COMMUNITY): Payer: Self-pay | Admitting: Psychiatry

## 2016-10-29 ENCOUNTER — Ambulatory Visit (INDEPENDENT_AMBULATORY_CARE_PROVIDER_SITE_OTHER): Payer: 59 | Admitting: Psychiatry

## 2016-10-29 VITALS — BP 108/65 | HR 66 | Ht 64.0 in | Wt 212.0 lb

## 2016-10-29 DIAGNOSIS — F331 Major depressive disorder, recurrent, moderate: Secondary | ICD-10-CM | POA: Diagnosis not present

## 2016-10-29 DIAGNOSIS — G4700 Insomnia, unspecified: Secondary | ICD-10-CM | POA: Diagnosis not present

## 2016-10-29 MED ORDER — SUVOREXANT 10 MG PO TABS: 10.0000 mg | ORAL_TABLET | Freq: Every day | ORAL | 0 refills | Status: AC

## 2016-10-29 MED ORDER — DULOXETINE HCL 60 MG PO CPEP: 60.0000 mg | ORAL_CAPSULE | Freq: Every day | ORAL | 1 refills | Status: AC

## 2016-10-29 NOTE — Patient Instructions (Signed)
1. Increase duloxetine 60 mg daily 2. Start Belsomra 10 mg at night 3. Return to clinic in one month for 30 mins

## 2016-11-03 ENCOUNTER — Other Ambulatory Visit: Payer: Self-pay | Admitting: Urology

## 2016-11-03 DIAGNOSIS — E278 Other specified disorders of adrenal gland: Secondary | ICD-10-CM

## 2016-11-03 DIAGNOSIS — E279 Disorder of adrenal gland, unspecified: Principal | ICD-10-CM

## 2016-11-05 ENCOUNTER — Other Ambulatory Visit: Payer: Self-pay | Admitting: Radiology

## 2016-11-06 ENCOUNTER — Encounter (HOSPITAL_COMMUNITY): Payer: Self-pay

## 2016-11-06 ENCOUNTER — Ambulatory Visit (HOSPITAL_COMMUNITY)
Admission: RE | Admit: 2016-11-06 | Discharge: 2016-11-06 | Disposition: A | Discharge status: Home / Self Care | Payer: Managed Care, Other (non HMO) | Source: Ambulatory Visit | Attending: Urology | Admitting: Urology

## 2016-11-06 DIAGNOSIS — E279 Disorder of adrenal gland, unspecified: Secondary | ICD-10-CM | POA: Diagnosis present

## 2016-11-06 DIAGNOSIS — Z85528 Personal history of other malignant neoplasm of kidney: Secondary | ICD-10-CM | POA: Diagnosis not present

## 2016-11-06 DIAGNOSIS — K219 Gastro-esophageal reflux disease without esophagitis: Secondary | ICD-10-CM | POA: Insufficient documentation

## 2016-11-06 DIAGNOSIS — E278 Other specified disorders of adrenal gland: Secondary | ICD-10-CM

## 2016-11-06 DIAGNOSIS — Z905 Acquired absence of kidney: Secondary | ICD-10-CM | POA: Insufficient documentation

## 2016-11-06 LAB — COMPREHENSIVE METABOLIC PANEL
ALBUMIN: 3.8 g/dL (ref 3.5–5.0)
ALT: 19 U/L (ref 14–54)
AST: 26 U/L (ref 15–41)
Alkaline Phosphatase: 63 U/L (ref 38–126)
Anion gap: 8 (ref 5–15)
BUN: 14 mg/dL (ref 6–20)
CHLORIDE: 108 mmol/L (ref 101–111)
CO2: 25 mmol/L (ref 22–32)
CREATININE: 0.84 mg/dL (ref 0.44–1.00)
Calcium: 8.7 mg/dL — ABNORMAL LOW (ref 8.9–10.3)
GFR calc Af Amer: >60 mL/min (ref >60)
GFR calc non Af Amer: >60 mL/min (ref >60)
GLUCOSE: 82 mg/dL (ref 65–99)
POTASSIUM: 3.8 mmol/L (ref 3.5–5.1)
SODIUM: 141 mmol/L (ref 135–145)
Total Bilirubin: 0.4 mg/dL (ref 0.3–1.2)
Total Protein: 7 g/dL (ref 6.5–8.1)

## 2016-11-06 LAB — CBC WITH DIFFERENTIAL/PLATELET
Basophils Absolute: 0 10*3/uL (ref 0.0–0.1)
Basophils Relative: 1 %
EOS PCT: 3 %
Eosinophils Absolute: 0.1 10*3/uL (ref 0.0–0.7)
HCT: 31.9 % — ABNORMAL LOW (ref 36.0–46.0)
HEMOGLOBIN: 9.6 g/dL — AB (ref 12.0–15.0)
LYMPHS ABS: 2.3 10*3/uL (ref 0.7–4.0)
LYMPHS PCT: 56 %
MCH: 23.3 pg — ABNORMAL LOW (ref 26.0–34.0)
MCHC: 30.1 g/dL (ref 30.0–36.0)
MCV: 77.4 fL — AB (ref 78.0–100.0)
Monocytes Absolute: 0.2 10*3/uL (ref 0.1–1.0)
Monocytes Relative: 6 %
Neutro Abs: 1.4 10*3/uL — ABNORMAL LOW (ref 1.7–7.7)
Neutrophils Relative %: 34 %
PLATELETS: 268 10*3/uL (ref 150–400)
RBC: 4.12 MIL/uL (ref 3.87–5.11)
RDW: 15.8 % — ABNORMAL HIGH (ref 11.5–15.5)
WBC: 4 10*3/uL (ref 4.0–10.5)

## 2016-11-06 LAB — PROTIME-INR
INR: 0.95
Prothrombin Time: 12.6 seconds (ref 11.4–15.2)

## 2016-11-06 MED ORDER — MIDAZOLAM HCL 2 MG/2ML IJ SOLN
INTRAMUSCULAR | Status: AC
  Filled 2016-11-06: qty 6

## 2016-11-06 MED ORDER — FENTANYL CITRATE (PF) 100 MCG/2ML IJ SOLN
INTRAMUSCULAR | Status: AC
  Filled 2016-11-06: qty 6

## 2016-11-06 MED ORDER — MIDAZOLAM HCL 2 MG/2ML IJ SOLN
INTRAMUSCULAR | Status: AC | PRN
  Administered 2016-11-06 (×2): 1 mg via INTRAVENOUS

## 2016-11-06 MED ORDER — LIDOCAINE HCL 1 % IJ SOLN
10.0000 mL | Freq: Once | INTRAMUSCULAR | Status: AC
  Administered 2016-11-06: 10 mL

## 2016-11-06 MED ORDER — SODIUM CHLORIDE 0.9 % IV SOLN
INTRAVENOUS | Status: DC
  Administered 2016-11-06: 08:00:00 via INTRAVENOUS

## 2016-11-06 MED ORDER — NALOXONE HCL 0.4 MG/ML IJ SOLN
INTRAMUSCULAR | Status: AC
  Filled 2016-11-06: qty 1

## 2016-11-06 MED ORDER — FLUMAZENIL 0.5 MG/5ML IV SOLN
INTRAVENOUS | Status: AC
  Filled 2016-11-06: qty 5

## 2016-11-06 MED ORDER — FENTANYL CITRATE (PF) 100 MCG/2ML IJ SOLN
INTRAMUSCULAR | Status: AC | PRN
  Administered 2016-11-06: 50 ug via INTRAVENOUS
  Administered 2016-11-06: 25 ug via INTRAVENOUS

## 2016-11-06 NOTE — Procedures (Signed)
Interventional Radiology Procedure Note  Procedure: CT guided right adrenal nodule biopsy.  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 .  Complications: None Recommendations:  - Ok to shower tomorrow - Do not submerge for 7 days - Routine wound care  - DC in 1 hr  Signed,  Dulcy Fanny. Earleen Newport, DO

## 2016-11-06 NOTE — Sedation Documentation (Signed)
Patient is resting comfortably. 

## 2016-11-06 NOTE — Discharge Instructions (Signed)
Moderate Conscious Sedation, Adult, Care After These instructions provide you with information about caring for yourself after your procedure. Your health care provider may also give you more specific instructions. Your treatment has been planned according to current medical practices, but problems sometimes occur. Call your health care provider if you have any problems or questions after your procedure. What can I expect after the procedure? After your procedure, it is common:  To feel sleepy for several hours.  To feel clumsy and have poor balance for several hours.  To have poor judgment for several hours.  To vomit if you eat too soon.  Follow these instructions at home: For at least 24 hours after the procedure:   Do not: ? Participate in activities where you could fall or become injured. ? Drive. ? Use heavy machinery. ? Drink alcohol. ? Take sleeping pills or medicines that cause drowsiness. ? Make important decisions or sign legal documents. ? Take care of children on your own.  Rest. Eating and drinking  Follow the diet recommended by your health care provider.  If you vomit: ? Drink water, juice, or soup when you can drink without vomiting. ? Make sure you have little or no nausea before eating solid foods. General instructions  Have a responsible adult stay with you until you are awake and alert.  Take over-the-counter and prescription medicines only as told by your health care provider.  If you smoke, do not smoke without supervision.  Keep all follow-up visits as told by your health care provider. This is important. Contact a health care provider if:  You keep feeling nauseous or you keep vomiting.  You feel light-headed.  You develop a rash.  You have a fever. Get help right away if:  You have trouble breathing. This information is not intended to replace advice given to you by your health care provider. Make sure you discuss any questions you have  with your health care provider. Document Released: 01/12/2013 Document Revised: 08/27/2015 Document Reviewed: 07/14/2015 Elsevier Interactive Patient Education  2018 [employer].   Needle Biopsy, Care After These instructions give you information about caring for yourself after your procedure. Your doctor may also give you more specific instructions. Call your doctor if you have any problems or questions after your procedure. Follow these instructions at home:  Rest as told by your doctor.  Take medicines only as told by your doctor.  There are many different ways to close and cover the biopsy site, including stitches (sutures), skin glue, and adhesive strips. Follow instructions from your doctor about: ? How to take care of your biopsy site. ? Please remove dressing tomorrow ? You may shower tomorrow  Check your biopsy site every day for signs of infection. Watch for: ? Redness, swelling, or pain. ? Fluid, blood, or pus. Contact a doctor if:  You have a fever.  You have redness, swelling, or pain at the biopsy site, and it lasts longer than a few days.  You have fluid, blood, or pus coming from the biopsy site.  You feel sick to your stomach (nauseous).  You throw up (vomit). Get help right away if:  You are short of breath.  You have trouble breathing.  Your chest hurts.  You feel dizzy or you pass out (faint).  You have bleeding that does not stop with pressure or a bandage.  You cough up blood.  Your belly (abdomen) hurts. This information is not intended to replace advice given to you by  your health care provider. Make sure you discuss any questions you have with your health care provider. Document Released: 03/06/2008 Document Revised: 08/30/2015 Document Reviewed: 03/20/2014 Elsevier Interactive Patient Education  [employer].

## 2016-11-06 NOTE — H&P (Signed)
Chief Complaint: right adrenal mass  Referring Physician:Dr. Louis Meckel  Supervising Physician: Corrie Mckusick  Patient Status: [facility] - Out-pt  HPI: Ellen Klein is a 46 y.o. female with a history of right RCC s/p partial nephrectomy by Dr. Louis Meckel in November of 2017.  She has done well, but on her 6 month follow up CT scan, she was found to have a right adrenal mass as well as a small left kidney lesion.  We have been asked to see her today for a biopsy of this adrenal lesion to determine etiology.  She has no other complaints and is otherwise doing well.  Past Medical History:  Past Medical History:  Diagnosis Date  . GERD without esophagitis   . Headache    migraines-"usually around menses"  . PONV (postoperative nausea and vomiting)   . Right kidney mass    surgery planned 02-13-16    Past Surgical History:  Past Surgical History:  Procedure Laterality Date  . CHOLECYSTECTOMY     laparoscopic  . GASTRIC ROUX-EN-Y     2016 - [facility] (130# loss)- weight has steadied now.  . ROBOTIC ASSITED PARTIAL NEPHRECTOMY Right 02/13/2016   Procedure: XI ROBOTIC ASSITED PARTIAL NEPHRECTOMY;  Surgeon: Ardis Hughs, MD;  Location: [facility]S;  Service: Urology;  Laterality: Right;  . rt kidney mass    . TONSILLECTOMY    . UVULECTOMY      Family History: History reviewed. No pertinent family history.  Social History:  reports that she has never smoked. She has never used smokeless tobacco. She reports that she drinks alcohol. She reports that she does not use drugs.  Allergies:  Allergies  Allergen Reactions  . Flagyl [Metronidazole] Nausea And Vomiting  . Imdur [Isosorbide Dinitrate]     Nausea and vomiting    Medications: Medications reviewed in epic  Please HPI for pertinent positives, otherwise complete 10 system ROS negative.  Mallampati Score: MD Evaluation Airway: WNL Heart: WNL Abdomen: WNL Chest/ Lungs: WNL ASA  Classification: 2 Mallampati/Airway Score:  One  Physical Exam: LMP 10/31/2016  Vitals reviewed in other encounter  General: pleasant, WD, WN white female who is laying in bed in NAD HEENT: head is normocephalic, atraumatic.  Sclera are noninjected.  PERRL.  Ears and nose without any masses or lesions.  Mouth is pink and moist Heart: regular, rate, and rhythm.  Normal s1,s2. No obvious murmurs, gallops, or rubs noted.  Palpable radial pulses bilaterally Lungs: CTAB, no wheezes, rhonchi, or rales noted.  Respiratory effort nonlabored Abd: soft, NT, ND, +BS, no masses, hernias, or organomegaly Psych: A&Ox3 with an appropriate affect.   Labs: Results for orders placed or performed during the hospital encounter of 11/06/16 (from the past 48 hour(s))  CBC with Differential/Platelet     Status: Abnormal   Collection Time: 11/06/16  7:16 AM  Result Value Ref Range   WBC 4.0 4.0 - 10.5 K/uL   RBC 4.12 3.87 - 5.11 MIL/uL   Hemoglobin 9.6 (L) 12.0 - 15.0 g/dL   HCT 31.9 (L) 36.0 - 46.0 %   MCV 77.4 (L) 78.0 - 100.0 fL   MCH 23.3 (L) 26.0 - 34.0 pg   MCHC 30.1 30.0 - 36.0 g/dL   RDW 15.8 (H) 11.5 - 15.5 %   Platelets 268 150 - 400 K/uL   Neutrophils Relative % 34 %   Neutro Abs 1.4 (L) 1.7 - 7.7 K/uL   Lymphocytes Relative 56 %   Lymphs Abs 2.3 0.7 -  4.0 K/uL   Monocytes Relative 6 %   Monocytes Absolute 0.2 0.1 - 1.0 K/uL   Eosinophils Relative 3 %   Eosinophils Absolute 0.1 0.0 - 0.7 K/uL   Basophils Relative 1 %   Basophils Absolute 0.0 0.0 - 0.1 K/uL  Comprehensive metabolic panel     Status: Abnormal   Collection Time: 11/06/16  7:16 AM  Result Value Ref Range   Sodium 141 135 - 145 mmol/L   Potassium 3.8 3.5 - 5.1 mmol/L   Chloride 108 101 - 111 mmol/L   CO2 25 22 - 32 mmol/L   Glucose, Bld 82 65 - 99 mg/dL   BUN 14 6 - 20 mg/dL   Creatinine, Ser 0.84 0.44 - 1.00 mg/dL   Calcium 8.7 (L) 8.9 - 10.3 mg/dL   Total Protein 7.0 6.5 - 8.1 g/dL   Albumin 3.8 3.5 - 5.0 g/dL   AST 26 15 - 41 U/L   ALT 19 14 - 54 U/L    Alkaline Phosphatase 63 38 - 126 U/L   Total Bilirubin 0.4 0.3 - 1.2 mg/dL   GFR calc non Af Amer >60 >60 mL/min   GFR calc Af Amer >60 >60 mL/min    Comment: (NOTE) The eGFR has been calculated using the CKD EPI equation. This calculation has not been validated in all clinical situations. eGFR's persistently <60 mL/min signify possible Chronic Kidney Disease.    Anion gap 8 5 - 15  Protime-INR     Status: None   Collection Time: 11/06/16  7:16 AM  Result Value Ref Range   Prothrombin Time 12.6 11.4 - 15.2 seconds   INR 0.95     Imaging: No results found.  Assessment/Plan 1. Right adrenal lesion  Patient has a history of a RCC of the right kidney s/p partial nephrectomy.  She has a right adrenal lesion that has been found.  We will plan to proceed with a biopsy today.  Her labs and vitals have been reviewed. Risks and benefits discussed with the patient including, but not limited to bleeding, infection, damage to adjacent structures or low yield requiring additional tests. All of the patient's questions were answered, patient is agreeable to proceed. Consent signed and in chart.  Thank you for this interesting consult.  I greatly enjoyed meeting Ellen Klein and look forward to participating in their care.  A copy of this report was sent to the requesting provider on this date.  Electronically Signed: Henreitta Cea 11/06/2016, 9:10 AM   I spent a total of  30 Minutes   in face to face in clinical consultation, greater than 50% of which was counseling/coordinating care for right adrenal lesion
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
## 2016-11-25 ENCOUNTER — Ambulatory Visit (HOSPITAL_COMMUNITY): Payer: 59 | Admitting: Psychiatry

## 2017-10-23 ENCOUNTER — Other Ambulatory Visit: Payer: Self-pay | Admitting: Urology

## 2017-11-30 NOTE — Patient Instructions (Addendum)
Ellen Klein  11/30/2017   Your procedure is scheduled on: 12-11-17   Report to [facility] Main  Entrance    Report to Admitting at 6:30 AM    Call this number if you have problems the morning of surgery [phone]   Remember: Do not eat food or drink liquids :After Midnight.     Take these medicines the morning of surgery with A SIP OF WATER: Duloxetine (Cymbalta)                                You may not have any metal on your body including hair pins and              piercings  Do not wear jewelry, make-up, lotions, powders or perfumes, deodorant             Do not wear nail polish.  Do not shave  48 hours prior to surgery.                Do not bring valuables to the hospital. [place].  Contacts, dentures or bridgework may not be worn into surgery.  Leave suitcase in the car. After surgery it may be brought to your room.                   Please read over the following fact sheets you were given: _____________________________________________________________________          [facility] - Preparing for Surgery Before surgery, you can play an important role.  Because skin is not sterile, your skin needs to be as free of germs as possible.  You can reduce the number of germs on your skin by washing with CHG (chlorahexidine gluconate) soap before surgery.  CHG is an antiseptic cleaner which kills germs and bonds with the skin to continue killing germs even after washing. Please DO NOT use if you have an allergy to CHG or antibacterial soaps.  If your skin becomes reddened/irritated stop using the CHG and inform your nurse when you arrive at Short Stay. Do not shave (including legs and underarms) for at least 48 hours prior to the first CHG shower.  You may shave your face/neck. Please follow these instructions carefully:  1.  Shower with CHG Soap the night before surgery and the  morning of  Surgery.  2.  If you choose to wash your hair, wash your hair first as usual with your  normal  shampoo.  3.  After you shampoo, rinse your hair and body thoroughly to remove the  shampoo.                           4.  Use CHG as you would any other liquid soap.  You can apply chg directly  to the skin and wash                       Gently with a scrungie or clean washcloth.  5.  Apply the CHG Soap to your body ONLY FROM THE NECK DOWN.   Do not use on face/ open  Wound or open sores. Avoid contact with eyes, ears mouth and genitals (private parts).                       Wash face,  Genitals (private parts) with your normal soap.             6.  Wash thoroughly, paying special attention to the area where your surgery  will be performed.  7.  Thoroughly rinse your body with warm water from the neck down.  8.  DO NOT shower/wash with your normal soap after using and rinsing off  the CHG Soap.                9.  Pat yourself dry with a clean towel.            10.  Wear clean pajamas.            11.  Place clean sheets on your bed the night of your first shower and do not  sleep with pets. Day of Surgery : Do not apply any lotions/deodorants the morning of surgery.  Please wear clean clothes to the hospital/surgery center.  FAILURE TO FOLLOW THESE INSTRUCTIONS MAY RESULT IN THE CANCELLATION OF YOUR SURGERY PATIENT SIGNATURE_________________________________  NURSE SIGNATURE__________________________________  ________________________________________________________________________  WHAT IS A BLOOD TRANSFUSION? Blood Transfusion Information  A transfusion is the replacement of blood or some of its parts. Blood is made up of multiple cells which provide different functions.  Red blood cells carry oxygen and are used for blood loss replacement.  White blood cells fight against infection.  Platelets control bleeding.  Plasma helps clot blood.  Other blood products are  available for specialized needs, such as hemophilia or other clotting disorders. BEFORE THE TRANSFUSION  Who gives blood for transfusions?   Healthy volunteers who are fully evaluated to make sure their blood is safe. This is blood bank blood. Transfusion therapy is the safest it has ever been in the practice of medicine. Before blood is taken from a donor, a complete history is taken to make sure that person has no history of diseases nor engages in risky social behavior (examples are intravenous drug use or sexual activity with multiple partners). The donor's travel history is screened to minimize risk of transmitting infections, such as malaria. The donated blood is tested for signs of infectious diseases, such as HIV and hepatitis. The blood is then tested to be sure it is compatible with you in order to minimize the chance of a transfusion reaction. If you or a relative donates blood, this is often done in anticipation of surgery and is not appropriate for emergency situations. It takes many days to process the donated blood. RISKS AND COMPLICATIONS Although transfusion therapy is very safe and saves many lives, the main dangers of transfusion include:   Getting an infectious disease.  Developing a transfusion reaction. This is an allergic reaction to something in the blood you were given. Every precaution is taken to prevent this. The decision to have a blood transfusion has been considered carefully by your caregiver before blood is given. Blood is not given unless the benefits outweigh the risks. AFTER THE TRANSFUSION  Right after receiving a blood transfusion, you will usually feel much better and more energetic. This is especially true if your red blood cells have gotten low (anemic). The transfusion raises the level of the red blood cells which carry oxygen, and this usually causes an energy increase.  The  nurse administering the transfusion will monitor you carefully for  complications. HOME CARE INSTRUCTIONS  No special instructions are needed after a transfusion. You may find your energy is better. Speak with your caregiver about any limitations on activity for underlying diseases you may have. SEEK MEDICAL CARE IF:   Your condition is not improving after your transfusion.  You develop redness or irritation at the intravenous (IV) site. SEEK IMMEDIATE MEDICAL CARE IF:  Any of the following symptoms occur over the next 12 hours:  Shaking chills.  You have a temperature by mouth above 102 F (38.9 C), not controlled by medicine.  Chest, back, or muscle pain.  People around you feel you are not acting correctly or are confused.  Shortness of breath or difficulty breathing.  Dizziness and fainting.  You get a rash or develop hives.  You have a decrease in urine output.  Your urine turns a dark color or changes to pink, red, or brown. Any of the following symptoms occur over the next 10 days:  You have a temperature by mouth above 102 F (38.9 C), not controlled by medicine.  Shortness of breath.  Weakness after normal activity.  The white part of the eye turns yellow (jaundice).  You have a decrease in the amount of urine or are urinating less often.  Your urine turns a dark color or changes to pink, red, or brown. Document Released: 03/21/2000 Document Revised: 06/16/2011 Document Reviewed: 11/08/2007 [facility] Patient Information 2014 [place], [place].  _______________________________________________________________________

## 2017-12-01 ENCOUNTER — Other Ambulatory Visit: Payer: Self-pay

## 2017-12-01 ENCOUNTER — Encounter (HOSPITAL_COMMUNITY)
Admission: RE | Admit: 2017-12-01 | Discharge: 2017-12-01 | Disposition: A | Discharge status: Home / Self Care | Payer: BLUE CROSS/BLUE SHIELD | Source: Ambulatory Visit | Attending: Urology | Admitting: Urology

## 2017-12-01 ENCOUNTER — Encounter (HOSPITAL_COMMUNITY): Payer: Self-pay

## 2017-12-01 DIAGNOSIS — N2889 Other specified disorders of kidney and ureter: Secondary | ICD-10-CM | POA: Diagnosis not present

## 2017-12-01 DIAGNOSIS — Z01812 Encounter for preprocedural laboratory examination: Secondary | ICD-10-CM | POA: Diagnosis not present

## 2017-12-01 DIAGNOSIS — Z0183 Encounter for blood typing: Secondary | ICD-10-CM | POA: Insufficient documentation

## 2017-12-01 HISTORY — DX: Family history of other specified conditions: Z84.89

## 2017-12-01 HISTORY — DX: Cardiac murmur, unspecified: R01.1

## 2017-12-01 HISTORY — DX: Anemia, unspecified: D64.9

## 2017-12-01 HISTORY — DX: Malignant (primary) neoplasm, unspecified: C80.1

## 2017-12-01 HISTORY — DX: Major depressive disorder, single episode, unspecified: F32.9

## 2017-12-01 HISTORY — DX: Depression, unspecified: F32.A

## 2017-12-01 HISTORY — DX: Anxiety disorder, unspecified: F41.9

## 2017-12-01 LAB — CBC
HCT: 41.8 % (ref 36.0–46.0)
HEMOGLOBIN: 13.4 g/dL (ref 12.0–15.0)
MCH: 31.2 pg (ref 26.0–34.0)
MCHC: 32.1 g/dL (ref 30.0–36.0)
MCV: 97.4 fL (ref 78.0–100.0)
PLATELETS: 201 10*3/uL (ref 150–400)
RBC: 4.29 MIL/uL (ref 3.87–5.11)
RDW: 14.1 % (ref 11.5–15.5)
WBC: 5.5 10*3/uL (ref 4.0–10.5)

## 2017-12-01 LAB — COMPREHENSIVE METABOLIC PANEL
ALK PHOS: 56 U/L (ref 38–126)
ALT: 32 U/L (ref 0–44)
AST: 29 U/L (ref 15–41)
Albumin: 3.4 g/dL — ABNORMAL LOW (ref 3.5–5.0)
Anion gap: 5 (ref 5–15)
BUN: 11 mg/dL (ref 6–20)
CALCIUM: 8.5 mg/dL — AB (ref 8.9–10.3)
CO2: 29 mmol/L (ref 22–32)
CREATININE: 0.76 mg/dL (ref 0.44–1.00)
Chloride: 109 mmol/L (ref 98–111)
Glucose, Bld: 85 mg/dL (ref 70–99)
Potassium: 4.7 mmol/L (ref 3.5–5.1)
SODIUM: 143 mmol/L (ref 135–145)
TOTAL PROTEIN: 5.9 g/dL — AB (ref 6.5–8.1)
Total Bilirubin: 0.6 mg/dL (ref 0.3–1.2)

## 2017-12-01 LAB — PREGNANCY, URINE: Preg Test, Ur: NEGATIVE

## 2017-12-02 LAB — URINE CULTURE: CULTURE: NO GROWTH

## 2017-12-08 ENCOUNTER — Ambulatory Visit (INDEPENDENT_AMBULATORY_CARE_PROVIDER_SITE_OTHER): Payer: Self-pay | Admitting: Internal Medicine

## 2017-12-11 ENCOUNTER — Inpatient Hospital Stay (HOSPITAL_COMMUNITY): Payer: BLUE CROSS/BLUE SHIELD | Admitting: Certified Registered Nurse Anesthetist

## 2017-12-11 ENCOUNTER — Encounter (HOSPITAL_COMMUNITY): Payer: Self-pay | Admitting: Certified Registered Nurse Anesthetist

## 2017-12-11 ENCOUNTER — Encounter (HOSPITAL_COMMUNITY)
Admission: RE | Disposition: A | Discharge status: Home / Self Care | Payer: Self-pay | Source: Home / Self Care | Attending: Urology

## 2017-12-11 ENCOUNTER — Inpatient Hospital Stay (HOSPITAL_COMMUNITY)
Admission: RE | Admit: 2017-12-11 | Discharge: 2017-12-12 | DRG: 658 | Disposition: A | Discharge status: Home / Self Care | Payer: BLUE CROSS/BLUE SHIELD | Attending: Urology | Admitting: Urology

## 2017-12-11 ENCOUNTER — Other Ambulatory Visit: Payer: Self-pay

## 2017-12-11 DIAGNOSIS — K219 Gastro-esophageal reflux disease without esophagitis: Secondary | ICD-10-CM | POA: Diagnosis present

## 2017-12-11 DIAGNOSIS — F419 Anxiety disorder, unspecified: Secondary | ICD-10-CM | POA: Diagnosis present

## 2017-12-11 DIAGNOSIS — C649 Malignant neoplasm of unspecified kidney, except renal pelvis: Principal | ICD-10-CM | POA: Diagnosis present

## 2017-12-11 DIAGNOSIS — E611 Iron deficiency: Secondary | ICD-10-CM | POA: Diagnosis present

## 2017-12-11 DIAGNOSIS — Z888 Allergy status to other drugs, medicaments and biological substances status: Secondary | ICD-10-CM | POA: Diagnosis not present

## 2017-12-11 DIAGNOSIS — R011 Cardiac murmur, unspecified: Secondary | ICD-10-CM | POA: Diagnosis present

## 2017-12-11 DIAGNOSIS — F329 Major depressive disorder, single episode, unspecified: Secondary | ICD-10-CM | POA: Diagnosis present

## 2017-12-11 DIAGNOSIS — Z9884 Bariatric surgery status: Secondary | ICD-10-CM

## 2017-12-11 DIAGNOSIS — N2889 Other specified disorders of kidney and ureter: Secondary | ICD-10-CM | POA: Diagnosis present

## 2017-12-11 HISTORY — PX: ROBOTIC ASSITED PARTIAL NEPHRECTOMY: SHX6087

## 2017-12-11 LAB — TYPE AND SCREEN
ABO/RH(D): O POS
ANTIBODY SCREEN: NEGATIVE

## 2017-12-11 LAB — BASIC METABOLIC PANEL
ANION GAP: 9 (ref 5–15)
BUN: 13 mg/dL (ref 6–20)
CHLORIDE: 108 mmol/L (ref 98–111)
CO2: 24 mmol/L (ref 22–32)
Calcium: 8.6 mg/dL — ABNORMAL LOW (ref 8.9–10.3)
Creatinine, Ser: 0.89 mg/dL (ref 0.44–1.00)
GFR calc Af Amer: >60 mL/min (ref >60)
GFR calc non Af Amer: >60 mL/min (ref >60)
GLUCOSE: 172 mg/dL — AB (ref 70–99)
Potassium: 5 mmol/L (ref 3.5–5.1)
Sodium: 141 mmol/L (ref 135–145)

## 2017-12-11 LAB — HEMOGLOBIN AND HEMATOCRIT, BLOOD
HEMATOCRIT: 44.2 % (ref 36.0–46.0)
Hemoglobin: 14.5 g/dL (ref 12.0–15.0)

## 2017-12-11 SURGERY — NEPHRECTOMY, PARTIAL, ROBOT-ASSISTED
Anesthesia: General | Laterality: Left

## 2017-12-11 MED ORDER — HYDROMORPHONE HCL 1 MG/ML IJ SOLN: 0.2500 mg | INTRAMUSCULAR | Status: DC | PRN

## 2017-12-11 MED ORDER — CEFAZOLIN SODIUM-DEXTROSE 2-4 GM/100ML-% IV SOLN
2.0000 g | INTRAVENOUS | Status: AC
  Administered 2017-12-11: 2 g via INTRAVENOUS
  Filled 2017-12-11: qty 100

## 2017-12-11 MED ORDER — BUPIVACAINE-EPINEPHRINE 0.5% -1:200000 IJ SOLN
INTRAMUSCULAR | Status: DC | PRN
  Administered 2017-12-11: 30 mL

## 2017-12-11 MED ORDER — SCOPOLAMINE 1 MG/3DAYS TD PT72
MEDICATED_PATCH | TRANSDERMAL | Status: AC
  Filled 2017-12-11: qty 1

## 2017-12-11 MED ORDER — ONDANSETRON HCL 4 MG/2ML IJ SOLN
4.0000 mg | Freq: Four times a day (QID) | INTRAMUSCULAR | Status: AC | PRN
  Administered 2017-12-11: 4 mg via INTRAVENOUS

## 2017-12-11 MED ORDER — FENTANYL CITRATE (PF) 100 MCG/2ML IJ SOLN
INTRAMUSCULAR | Status: AC
  Filled 2017-12-11: qty 2

## 2017-12-11 MED ORDER — SODIUM CHLORIDE 0.9 % IJ SOLN
INTRAMUSCULAR | Status: AC
  Filled 2017-12-11: qty 20

## 2017-12-11 MED ORDER — ROCURONIUM BROMIDE 10 MG/ML (PF) SYRINGE
PREFILLED_SYRINGE | INTRAVENOUS | Status: AC
  Filled 2017-12-11: qty 10

## 2017-12-11 MED ORDER — MIDAZOLAM HCL 5 MG/5ML IJ SOLN
INTRAMUSCULAR | Status: DC | PRN
  Administered 2017-12-11: 2 mg via INTRAVENOUS

## 2017-12-11 MED ORDER — LACTATED RINGERS IV SOLN
INTRAVENOUS | Status: DC
  Administered 2017-12-11 – 2017-12-12 (×3): via INTRAVENOUS

## 2017-12-11 MED ORDER — ONDANSETRON HCL 4 MG/2ML IJ SOLN
INTRAMUSCULAR | Status: AC
  Filled 2017-12-11: qty 2

## 2017-12-11 MED ORDER — PROPOFOL 10 MG/ML IV BOLUS
INTRAVENOUS | Status: DC | PRN
  Administered 2017-12-11: 150 mg via INTRAVENOUS

## 2017-12-11 MED ORDER — FENTANYL CITRATE (PF) 250 MCG/5ML IJ SOLN
INTRAMUSCULAR | Status: AC
  Filled 2017-12-11: qty 5

## 2017-12-11 MED ORDER — SODIUM CHLORIDE 0.9 % IJ SOLN
INTRAMUSCULAR | Status: DC | PRN
  Administered 2017-12-11: 15 mL

## 2017-12-11 MED ORDER — SCOPOLAMINE 1 MG/3DAYS TD PT72
MEDICATED_PATCH | TRANSDERMAL | Status: DC | PRN
  Administered 2017-12-11: 1 via TRANSDERMAL

## 2017-12-11 MED ORDER — BUPIVACAINE LIPOSOME 1.3 % IJ SUSP
20.0000 mL | Freq: Once | INTRAMUSCULAR | Status: AC
  Administered 2017-12-11: 20 mL
  Filled 2017-12-11: qty 20

## 2017-12-11 MED ORDER — PROPOFOL 10 MG/ML IV BOLUS
INTRAVENOUS | Status: AC
  Filled 2017-12-11: qty 20

## 2017-12-11 MED ORDER — HEMOSTATIC AGENTS (NO CHARGE) OPTIME
TOPICAL | Status: DC | PRN
  Administered 2017-12-11: 1

## 2017-12-11 MED ORDER — MIDAZOLAM HCL 2 MG/2ML IJ SOLN
INTRAMUSCULAR | Status: AC
  Filled 2017-12-11: qty 2

## 2017-12-11 MED ORDER — BUPIVACAINE-EPINEPHRINE (PF) 0.5% -1:200000 IJ SOLN
INTRAMUSCULAR | Status: AC
  Filled 2017-12-11: qty 30

## 2017-12-11 MED ORDER — ONDANSETRON HCL 4 MG/2ML IJ SOLN
INTRAMUSCULAR | Status: DC | PRN
  Administered 2017-12-11: 4 mg via INTRAVENOUS

## 2017-12-11 MED ORDER — THROMBIN (RECOMBINANT) 5000 UNITS EX SOLR
CUTANEOUS | Status: AC
  Filled 2017-12-11: qty 5000

## 2017-12-11 MED ORDER — OXYCODONE HCL 5 MG PO TABS: 5.0000 mg | ORAL_TABLET | Freq: Once | ORAL | Status: DC | PRN

## 2017-12-11 MED ORDER — STERILE WATER FOR IRRIGATION IR SOLN
Status: DC | PRN
  Administered 2017-12-11: 1000 mL

## 2017-12-11 MED ORDER — TRAMADOL HCL 50 MG PO TABS
50.0000 mg | ORAL_TABLET | Freq: Four times a day (QID) | ORAL | Status: DC | PRN
  Administered 2017-12-11 – 2017-12-12 (×3): 50 mg via ORAL
  Filled 2017-12-11 (×3): qty 1

## 2017-12-11 MED ORDER — LACTATED RINGERS IV SOLN
INTRAVENOUS | Status: DC
  Administered 2017-12-11: 10000 mL via INTRAVENOUS
  Administered 2017-12-11 (×2): via INTRAVENOUS

## 2017-12-11 MED ORDER — SUGAMMADEX SODIUM 200 MG/2ML IV SOLN
INTRAVENOUS | Status: DC | PRN
  Administered 2017-12-11: 300 mg via INTRAVENOUS

## 2017-12-11 MED ORDER — FENTANYL CITRATE (PF) 100 MCG/2ML IJ SOLN
INTRAMUSCULAR | Status: DC | PRN
  Administered 2017-12-11 (×7): 50 ug via INTRAVENOUS
  Administered 2017-12-11: 100 ug via INTRAVENOUS

## 2017-12-11 MED ORDER — SUGAMMADEX SODIUM 200 MG/2ML IV SOLN
INTRAVENOUS | Status: AC
  Filled 2017-12-11: qty 2

## 2017-12-11 MED ORDER — PROMETHAZINE HCL 25 MG/ML IJ SOLN
INTRAMUSCULAR | Status: AC
  Filled 2017-12-11: qty 1

## 2017-12-11 MED ORDER — HYDROMORPHONE HCL 1 MG/ML IJ SOLN
INTRAMUSCULAR | Status: AC
  Filled 2017-12-11: qty 1

## 2017-12-11 MED ORDER — DULOXETINE HCL 60 MG PO CPEP
60.0000 mg | ORAL_CAPSULE | Freq: Every day | ORAL | Status: DC
  Administered 2017-12-11 – 2017-12-12 (×2): 60 mg via ORAL
  Filled 2017-12-11 (×3): qty 1

## 2017-12-11 MED ORDER — PROMETHAZINE HCL 25 MG/ML IJ SOLN
INTRAMUSCULAR | Status: DC | PRN
  Administered 2017-12-11: 6.25 mg via INTRAVENOUS

## 2017-12-11 MED ORDER — DEXAMETHASONE SODIUM PHOSPHATE 4 MG/ML IJ SOLN
INTRAMUSCULAR | Status: DC | PRN
  Administered 2017-12-11: 10 mg via INTRAVENOUS

## 2017-12-11 MED ORDER — TRAMADOL HCL 50 MG PO TABS: 50.0000 mg | ORAL_TABLET | Freq: Four times a day (QID) | ORAL | 0 refills | Status: AC | PRN

## 2017-12-11 MED ORDER — LIDOCAINE 2% (20 MG/ML) 5 ML SYRINGE
INTRAMUSCULAR | Status: DC | PRN
  Administered 2017-12-11: 60 mg via INTRAVENOUS

## 2017-12-11 MED ORDER — OXYCODONE HCL 5 MG/5ML PO SOLN: 5.0000 mg | Freq: Once | ORAL | Status: DC | PRN

## 2017-12-11 MED ORDER — ONDANSETRON HCL 4 MG/2ML IJ SOLN
4.0000 mg | INTRAMUSCULAR | Status: DC | PRN
  Administered 2017-12-11: 4 mg via INTRAVENOUS
  Filled 2017-12-11: qty 2

## 2017-12-11 MED ORDER — ACETAMINOPHEN 10 MG/ML IV SOLN
1000.0000 mg | Freq: Four times a day (QID) | INTRAVENOUS | Status: AC
  Administered 2017-12-11 – 2017-12-12 (×4): 1000 mg via INTRAVENOUS
  Filled 2017-12-11 (×4): qty 100

## 2017-12-11 MED ORDER — ACETAMINOPHEN 10 MG/ML IV SOLN
INTRAVENOUS | Status: AC
  Administered 2017-12-11: 1000 mg
  Filled 2017-12-11: qty 100

## 2017-12-11 MED ORDER — PHENYLEPHRINE 40 MCG/ML (10ML) SYRINGE FOR IV PUSH (FOR BLOOD PRESSURE SUPPORT)
PREFILLED_SYRINGE | INTRAVENOUS | Status: AC
  Filled 2017-12-11: qty 10

## 2017-12-11 MED ORDER — DEXAMETHASONE SODIUM PHOSPHATE 10 MG/ML IJ SOLN
INTRAMUSCULAR | Status: AC
  Filled 2017-12-11: qty 1

## 2017-12-11 MED ORDER — EPHEDRINE SULFATE-NACL 50-0.9 MG/10ML-% IV SOSY
PREFILLED_SYRINGE | INTRAVENOUS | Status: DC | PRN
  Administered 2017-12-11 (×2): 5 mg via INTRAVENOUS

## 2017-12-11 MED ORDER — ROCURONIUM BROMIDE 10 MG/ML (PF) SYRINGE
PREFILLED_SYRINGE | INTRAVENOUS | Status: DC | PRN
  Administered 2017-12-11: 10 mg via INTRAVENOUS
  Administered 2017-12-11: 50 mg via INTRAVENOUS
  Administered 2017-12-11: 30 mg via INTRAVENOUS

## 2017-12-11 MED ORDER — SUGAMMADEX SODIUM 500 MG/5ML IV SOLN
INTRAVENOUS | Status: AC
  Filled 2017-12-11: qty 5

## 2017-12-11 MED ORDER — LACTATED RINGERS IR SOLN
Status: DC | PRN
  Administered 2017-12-11: 1000 mL

## 2017-12-11 SURGICAL SUPPLY — 64 items
APPLICATOR ARISTA FLEXITIP XL (MISCELLANEOUS) IMPLANT
APPLICATOR SURGIFLO ENDO (HEMOSTASIS) IMPLANT
CHLORAPREP W/TINT 26ML (MISCELLANEOUS) ×3 IMPLANT
CLIP VESOLOCK LG 6/CT PURPLE (CLIP) ×3 IMPLANT
CLIP VESOLOCK MED LG 6/CT (CLIP) ×6 IMPLANT
CLIP VESOLOCK XL 6/CT (CLIP) IMPLANT
COVER SURGICAL LIGHT HANDLE (MISCELLANEOUS) ×3 IMPLANT
COVER TIP SHEARS 8 DVNC (MISCELLANEOUS) ×1 IMPLANT
COVER TIP SHEARS 8MM DA VINCI (MISCELLANEOUS) ×2
DECANTER SPIKE VIAL GLASS SM (MISCELLANEOUS) ×3 IMPLANT
DERMABOND ADVANCED (GAUZE/BANDAGES/DRESSINGS) ×2
DERMABOND ADVANCED .7 DNX12 (GAUZE/BANDAGES/DRESSINGS) ×1 IMPLANT
DRAIN CHANNEL 15F RND FF 3/16 (WOUND CARE) ×3 IMPLANT
DRAPE ARM DVNC X/XI (DISPOSABLE) ×4 IMPLANT
DRAPE COLUMN DVNC XI (DISPOSABLE) ×1 IMPLANT
DRAPE DA VINCI XI ARM (DISPOSABLE) ×8
DRAPE DA VINCI XI COLUMN (DISPOSABLE) ×2
DRAPE INCISE IOBAN 66X45 STRL (DRAPES) ×3 IMPLANT
DRAPE SHEET LG 3/4 BI-LAMINATE (DRAPES) ×3 IMPLANT
ELECT PENCIL ROCKER SW 15FT (MISCELLANEOUS) ×3 IMPLANT
ELECT REM PT RETURN 15FT ADLT (MISCELLANEOUS) ×3 IMPLANT
EVACUATOR SILICONE 100CC (DRAIN) ×3 IMPLANT
GLOVE BIO SURGEON STRL SZ 6.5 (GLOVE) ×2 IMPLANT
GLOVE BIO SURGEONS STRL SZ 6.5 (GLOVE) ×1
GLOVE BIOGEL M STRL SZ7.5 (GLOVE) ×6 IMPLANT
GOWN STRL REUS W/TWL LRG LVL3 (GOWN DISPOSABLE) ×9 IMPLANT
HEMOSTAT ARISTA ABSORB 3G PWDR (MISCELLANEOUS) IMPLANT
HEMOSTAT SURGICEL 4X8 (HEMOSTASIS) ×3 IMPLANT
IRRIG SUCT STRYKERFLOW 2 WTIP (MISCELLANEOUS) ×3
IRRIGATION SUCT STRKRFLW 2 WTP (MISCELLANEOUS) ×1 IMPLANT
KIT BASIN OR (CUSTOM PROCEDURE TRAY) ×3 IMPLANT
LOOP VESSEL MAXI BLUE (MISCELLANEOUS) IMPLANT
MARKER SKIN DUAL TIP RULER LAB (MISCELLANEOUS) ×3 IMPLANT
NEEDLE INSUFFLATION 14GA 120MM (NEEDLE) IMPLANT
NS IRRIG 1000ML POUR BTL (IV SOLUTION) ×3 IMPLANT
PAD POSITIONING PINK XL (MISCELLANEOUS) ×3 IMPLANT
PORT ACCESS TROCAR AIRSEAL 12 (TROCAR) ×1 IMPLANT
PORT ACCESS TROCAR AIRSEAL 5M (TROCAR) ×2
POSITIONER SURGICAL ARM (MISCELLANEOUS) ×6 IMPLANT
POUCH SPECIMEN RETRIEVAL 10MM (ENDOMECHANICALS) ×3 IMPLANT
SEAL CANN UNIV 5-8 DVNC XI (MISCELLANEOUS) ×4 IMPLANT
SEAL XI 5MM-8MM UNIVERSAL (MISCELLANEOUS) ×8
SET TRI-LUMEN FLTR TB AIRSEAL (TUBING) ×3 IMPLANT
SOLUTION ELECTROLUBE (MISCELLANEOUS) ×3 IMPLANT
SPOGE SURGIFLO 8M (HEMOSTASIS)
SPONGE SURGIFLO 8M (HEMOSTASIS) IMPLANT
SURGIFLO W/THROMBIN 8M KIT (HEMOSTASIS) IMPLANT
SUT ETHILON 3 0 PS 1 (SUTURE) ×3 IMPLANT
SUT MNCRL AB 4-0 PS2 18 (SUTURE) ×6 IMPLANT
SUT V-LOC BARB 180 2/0GR6 GS22 (SUTURE) ×3
SUT VIC AB 0 CT1 27 (SUTURE) ×4
SUT VIC AB 0 CT1 27XBRD ANTBC (SUTURE) ×2 IMPLANT
SUT VICRYL 0 UR6 27IN ABS (SUTURE) IMPLANT
SUT VLOC BARB 180 ABS3/0GR12 (SUTURE) ×3
SUTURE V-LC BRB 180 2/0GR6GS22 (SUTURE) ×1 IMPLANT
SUTURE VLOC BRB 180 ABS3/0GR12 (SUTURE) ×1 IMPLANT
TOWEL OR 17X26 10 PK STRL BLUE (TOWEL DISPOSABLE) ×3 IMPLANT
TOWEL OR NON WOVEN STRL DISP B (DISPOSABLE) ×3 IMPLANT
TRAY FOLEY MTR SLVR 14FR STAT (SET/KITS/TRAYS/PACK) ×3 IMPLANT
TRAY FOLEY MTR SLVR 16FR STAT (SET/KITS/TRAYS/PACK) IMPLANT
TRAY LAPAROSCOPIC (CUSTOM PROCEDURE TRAY) ×3 IMPLANT
TROCAR BLADELESS OPT 5 100 (ENDOMECHANICALS) IMPLANT
TROCAR XCEL 12X100 BLDLESS (ENDOMECHANICALS) IMPLANT
WATER STERILE IRR 1000ML POUR (IV SOLUTION) ×3 IMPLANT

## 2017-12-11 NOTE — Op Note (Signed)
Preoperative diagnosis:  1. Left renal mass   Postoperative diagnosis:  1. same   Procedure: 1. Robotic assisted laparoscopic left partial nephrectomy  Surgeon: Ardis Hughs, MD 1st assistant: Debbrah Alar, PA-C Resident Surgeon: Basilio Cairo, MD  Anesthesia: General  Complications: None  Intraoperative findings:  #1. Small 1.5cm largely exophytic lesion on upper portion of mid-pole in anterolateral position - margins grossly negative #2. Warm ischemia time 8  EBL: 50cc  Specimens: left renal mass   Indication: Ellen Klein is a 47 y.o. patient with left renal mass.  After reviewing the management options for treatment, he elected to proceed with the above surgical procedure(s). We have discussed the potential benefits and risks of the procedure, side effects of the proposed treatment, the likelihood of the patient achieving the goals of the procedure, and any potential problems that might occur during the procedure or recuperation. Informed consent has been obtained.  Description of procedure:  An assistant was required for this surgical procedure.  The duties of the assistant included but were not limited to suctioning, passing suture, camera manipulation, retraction. This procedure would not be able to be performed without an Environmental consultant.  The patient was taken to the operating room and a general anesthetic was administered. The patient was given preoperative antibiotics, placed in the right modified flank position with care to pad all potential pressure points, and prepped and draped in the usual sterile fashion. Next a preoperative timeout was performed.  A site was selected on the left side of the umbilicus for placement of the camera port. This was placed using a standard modified Hassan technique with entry into the peritoneum with a  8 mm tocar. We entered the peritoneum without incident and established pneumoperitoneum. The camera was then used to inspect the  abdomen and there was no evidence of any intra-abdominal injuries or other abnormalities. The remaining abdominal ports were then placed. 8 mm robotic ports were placed in the left upper quadrant, left lower quadrant, and left lateral abdominal wall, making a soft J. A 12 mm port was placed in the upper midline for laparoscopic assistance. All ports were placed under direct vision without difficulty. The surgical cart was then docked.   Utilizing the cautery scissors, the white line of Toldt was incised allowing the colon to be mobilized medially and the plane between the mesocolon and the anterior layer of Gerota's fascia to be developed and the kidney to be exposed. The ureter and gonadal vein were identified inferiorly and the ureter was lifted anteriorly off the psoas muscle. Dissection proceeded superiorly along the gonadal vein until the renal vein was identified. The renal hilum was then carefully isolated with a combination of blunt and sharp dissection allowing the renal arterial and venous structures to be separated and isolated in preparation for renal hilar vessel clamping.  Attention turned to the kidney and the perinephric fat surrounding the renal mass was removed and the kidney was mobilized sufficiently for exposure and resection of the renal mass.   Once the renal mass was properly isolated, preparations were made for resection of the tumor. The renal artery was then clamped with a bulldog clamp. The tumor was then excised with cold scissor dissection along with an adequate visible gross margin of normal renal parenchyma. The tumor appeared to be excised without any gross violation of the tumor. The renal collecting system was not entered during removal of the tumor. A running 3-0 V-lock suture was then brought through the capsule of the  kidney and run along the base of the renal defect to provide hemostasis and close any entry into the renal collecting system if present. Weck clips  were used to secure this suture outside the renal capsule at the proximal and distal ends. The bulldog clamps were then removed from the renal hilar vessel. A running 2-0 V lock suture was then used to close the capsule of the kidney using a sliding clip technique which resulted in excellent hemostasis. An additional hemostatic agent (Surgiflo) was then placed into the renal defect.   Total warm renal ischemia time was 8 minutes. The renal tumor resection site was examined. Hemostasis appeared adequate.   The kidney was placed back into its normal anatomic position and covered with perinephric fat as needed. A # 81 Blake drain was then brought through the lateral lower port site and positioned in the perinephric space. It was secured to the skin with a nylon suture. The surgical robotic cart was undocked. The renal tumor specimen was removed intact within an endopouch retrieval bag via the camera port sites. The camera port site and the other 12 mm port site were then closed at the fascial level with 0-vicryl suture. All other laparoscopic/robotic ports were removed under direct vision and the pneumoperitoneum let down with inspection of the operative field performed and hemostasis again confirmed. All incision sites were then injected with local anesthetic and reapproximated at the skin level with 4-0 monocryl subcuticular closures. Dermabond was applied to the skin. The patient tolerated the procedure well and without complications. The patient was able to be extubated and transferred to the recovery unit in satisfactory condition.  Ardis Hughs, M.D.

## 2017-12-11 NOTE — Transfer of Care (Signed)
Immediate Anesthesia Transfer of Care Note  Patient: Ellen Klein  Procedure(s) Performed: XI ROBOTIC ASSITED PARTIAL NEPHRECTOMY (Left )  Patient Location: PACU  Anesthesia Type:General  Level of Consciousness: drowsy and patient cooperative  Airway & Oxygen Therapy: Patient Spontanous Breathing and Patient connected to face mask  Post-op Assessment: Report given to RN and Post -op Vital signs reviewed and stable  Post vital signs: Reviewed and stable  Last Vitals:  Vitals Value Taken Time  BP 134/52 12/11/2017 11:15 AM  Temp    Pulse 68 12/11/2017 11:16 AM  Resp 19 12/11/2017 11:16 AM  SpO2 100 % 12/11/2017 11:16 AM  Vitals shown include unvalidated device data.  Last Pain:  Vitals:   12/11/17 0655  TempSrc:   PainSc: 0-No pain      Patients Stated Pain Goal: 4 (00/63/49 [id number])  Complications: No apparent anesthesia complications

## 2017-12-11 NOTE — Anesthesia Procedure Notes (Addendum)
Procedure Name: Intubation Date/Time: 12/11/2017 8:45 AM Performed by: Claudia Desanctis, CRNA Pre-anesthesia Checklist: Patient identified, Emergency Drugs available, Suction available and Patient being monitored Patient Re-evaluated:Patient Re-evaluated prior to induction Oxygen Delivery Method: Circle system utilized Preoxygenation: Pre-oxygenation with 100% oxygen Induction Type: IV induction Ventilation: Mask ventilation without difficulty Laryngoscope Size: Mac and 3 Grade View: Grade I Tube type: Oral Tube size: 7.5 mm Number of attempts: 1 Airway Equipment and Method: Stylet Placement Confirmation: ETT inserted through vocal cords under direct vision,  positive ETCO2 and breath sounds checked- equal and bilateral Secured at: 23 cm Tube secured with: Tape Dental Injury: Teeth and Oropharynx as per pre-operative assessment  Comments: Intubated by Viann Fish, SRNA

## 2017-12-11 NOTE — Anesthesia Preprocedure Evaluation (Signed)
Anesthesia Evaluation  Patient identified by MRN, date of birth, ID band Patient awake    Reviewed: Allergy & Precautions, H&P , NPO status , Patient's Chart, lab work & pertinent test results  History of Anesthesia Complications (+) PONV and history of anesthetic complications  Airway Mallampati: II   Neck ROM: full    Dental   Pulmonary    breath sounds clear to auscultation       Cardiovascular negative cardio ROS   Rhythm:regular Rate:Normal     Neuro/Psych  Headaches, PSYCHIATRIC DISORDERS Anxiety Depression    GI/Hepatic GERD  ,  Endo/Other    Renal/GU      Musculoskeletal   Abdominal   Peds  Hematology   Anesthesia Other Findings   Reproductive/Obstetrics                             Anesthesia Physical Anesthesia Plan  ASA: II  Anesthesia Plan: General   Post-op Pain Management:    Induction: Intravenous  PONV Risk Score and Plan: 4 or greater and Ondansetron, Dexamethasone, Midazolam, Treatment may vary due to age or medical condition and Scopolamine patch - Pre-op  Airway Management Planned: Oral ETT  Additional Equipment:   Intra-op Plan:   Post-operative Plan: Extubation in OR  Informed Consent: I have reviewed the patients History and Physical, chart, labs and discussed the procedure including the risks, benefits and alternatives for the proposed anesthesia with the patient or authorized representative who has indicated his/her understanding and acceptance.     Plan Discussed with: CRNA, Anesthesiologist and Surgeon  Anesthesia Plan Comments:         Anesthesia Quick Evaluation

## 2017-12-11 NOTE — Discharge Instructions (Signed)

## 2017-12-11 NOTE — H&P (Signed)
Pt presents today for pre-operative history and physical exam in anticipation of robotic left partial nephrectomy on 12/11/17 by Dr. Louis Meckel. She is doing well and without complaint. She is well acquainted with the procedure as she has a right partial by Dr. Louis Meckel in 2017. Pt denies F/C, CP, SOB, N/V, diarrhea/constipation, flank pain, hematuria, and dysuria. She does have mild, intermittent back pain and occasional headaches both of which are easily treated with OTC meds. She has depression and anxiety which respond well to her prescribed medication.   HX:     CC: f/u for kidney cancer  HPI: Ellen Klein is a 47 year-old female established patient who is here for kidney cancer follow-up.  CT 4/18: New 1.3x1.8cm right adrenal nodule - indeterminate; 0.7cm enhancing mid-pole posterior left renal mass.  CT1/19: 14x70mm left posterior enhancing renal lesion   Colonoscopy 5/18: Negative  Mamogram 6/18: Negative  CXR 4/18: Negative  8/18: Adrenal nodule biopsy, negative for malignancy   Interval:  She feels well. She denies any weight loss. She denies any flank pain. She denies any stigmata functioning of adrenal adenoma. The patient states that she has some mild left-sided flank discomfort. She denies any voiding symptoms. She has recently been started on iron for iron deficiency related to her gastric bypass.   The patient is s/p right partial nephrectomy. Her surgery was done approximately 02/13/2016.   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 The resection margins were negative.   Her last CT scan was on 04/20/2017. The results of the CT scan demonstrated enhancing mass in the left posterior aspect of the kidney has increased from 0.9 mm to 1.2 mm in size, adrenal nodule on the right stable..   The last CXR was 07/19/2016. The CXR demonstrated negative.   The patient had labs prior to her office visit today. Pertinent labs: 1/19: BUN/Cr 10/1.0.   She is not having flank pain.  She has not had blood in her urine recently. She has not recently had unwanted weight loss. She is not having normal bowel movements. She does have a good appetite. She is not having pain in new locations. The patient isnot complaining of incisional hernia or pain.     ALLERGIES: Flagyl - Nausea, Vomiting Imdur TB24 - Nausea, Vomiting    MEDICATIONS: Cymbalta 60 mg capsule,delayed release  Tylenol PRN     GU PSH: Locm 300-399Mg /Ml Iodine,1Ml - 10/20/2017, 04/20/2017, 07/25/2016 Partial nephrectomy (laparoscopic) - 02/13/2016    NON-GU PSH: Cholecystectomy (open) Gastric Bypass For Obesity Sinus Surgery..    GU PMH: Renal cell carcinoma, right - 10/20/2017, - 04/20/2017, - 10/30/2016, The patient has no evidence of local recurrence of her kidney cancer in the right kidney. However, there is a new indeterminate right renal nodule as well as a very small renal mass on the contralateral kidney., - 07/31/2016, T1a renal cell carcinoma-clear cell variant. The margins are negative. The patient's risk of recurrence of the next 5 years is less than 2%., - 02/19/2016 Benign tumor left kidney - 04/20/2017 Benign Neo Kidney, Unspec, Right, The patient has a completely endophytic 2.2 cm -1.8 cm right upper pole posterior renal mass with enhancement concerning for renal cell carcinoma. The patient has a single artery with an early branch to the upper pole. She has a single vein. 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   NON-GU PMH: Adrenal neoplasm - 07/31/2016    FAMILY HISTORY: Diabetes - Mother, Father   SOCIAL HISTORY: Marital Status: Single Preferred Language: English; Ethnicity: Not Hispanic Or Latino; Race: White Current Smoking Status: Patient has never smoked.  Does not use smokeless tobacco. Social Drinker.  Does not use drugs. Drinks 1 caffeinated drink per day. Has not had  a blood transfusion. 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     Notes: occasional ETOH   REVIEW OF SYSTEMS:    GU Review Female:   Patient denies frequent urination, hard to postpone urination, burning /pain with urination, get up at night to urinate, leakage of urine, stream starts and stops, trouble starting your stream, have to strain to urinate, and being pregnant.  Gastrointestinal (Upper):   Patient denies nausea, vomiting, and indigestion/ heartburn.  Gastrointestinal (Lower):   Patient denies diarrhea and constipation.  Constitutional:   Patient denies fever, night sweats, weight loss, and fatigue.  Skin:   Patient denies skin rash/ lesion and itching.  Eyes:   Patient denies blurred vision and double vision.  Ears/ Nose/ Throat:   Patient denies sore throat and sinus problems.  Hematologic/Lymphatic:   Patient denies swollen glands and easy bruising.  Cardiovascular:   Patient denies leg swelling and chest pains.  Respiratory:   Patient denies cough and shortness of breath.  Endocrine:   Patient denies excessive thirst.  Musculoskeletal:   Patient reports back pain. Patient denies joint pain.  Neurological:   Patient reports headaches. Patient denies dizziness.  Psychologic:   Patient reports depression and anxiety.    VITAL SIGNS:      12/01/2017 01:12 PM  Weight 228 lb / 103.42 kg  Height 64 in / 162.56 cm  BP 128/84 mmHg  Pulse 62 /min  Temperature 98.2 F / 36.7 C  BMI 39.1 kg/m   MULTI-SYSTEM PHYSICAL EXAMINATION:    Constitutional: Well-nourished. No physical deformities. Normally developed. Good grooming.  Neck: Neck symmetrical, not swollen. Normal tracheal position.   Respiratory: Normal breath sounds. No labored breathing, no use of accessory muscles.   Cardiovascular: Normal temperature, normal extremity pulses, no swelling. Systolic II/VI murmur  Lymphatic: No enlargement of neck, axillae, groin.  Skin: No paleness, no jaundice, no cyanosis. No  lesion, no ulcer, no rash.  Neurologic / Psychiatric: Oriented to time, oriented to place, oriented to person. No depression, no anxiety, no agitation.  Gastrointestinal: No mass, no tenderness, no rigidity, non obese abdomen.  Eyes: Normal conjunctivae. Normal eyelids.  Ears, Nose, Mouth, and Throat: Left ear no scars, no lesions, no masses. Right ear no scars, no lesions, no masses. Nose no scars, no lesions, no masses. Normal hearing. Normal lips.  Musculoskeletal: Normal gait and station of head and neck.     PAST DATA REVIEWED:  Source Of History:  Patient  Records Review:   Previous Patient Records  Urine Test Review:   Urinalysis   12/01/17  Urinalysis  Urine Appearance Clear   Urine Color Yellow   Urine Glucose Neg mg/dL  Urine Bilirubin Neg mg/dL  Urine Ketones Neg mg/dL  Urine Specific Gravity 1.015   Urine Blood Neg ery/uL  Urine pH 6.0   Urine Protein Neg mg/dL  Urine Urobilinogen 0.2 mg/dL  Urine Nitrites Neg   Urine Leukocyte Esterase Neg leu/uL   PROCEDURES:          Urinalysis - 81003 Dipstick Dipstick Cont'd  Color: Yellow Bilirubin: Neg mg/dL  Appearance: Clear Ketones: Neg mg/dL  Specific Gravity: 1.015 Blood: Neg ery/uL  pH: 6.0 Protein: Neg mg/dL  Glucose: Neg mg/dL Urobilinogen: 0.2 mg/dL    Nitrites: Neg    Leukocyte Esterase: Neg leu/uL    ASSESSMENT:      ICD-10 Details  1 GU:   Benign tumor left kidney - D30.02    PLAN:           Schedule Return Visit/Planned Activity: Keep Scheduled Appointment - Schedule Surgery          Document Letter(s):  Created for Patient: Clinical Summary         Notes:   There are no changes in the patients history or physical exam since last evaluation by Dr. Louis Meckel. Pt is scheduled to undergo left robotic partial nephrectomy on 12/11/17.   All pt's questions were answered to the best of my ability.

## 2017-12-11 NOTE — Anesthesia Postprocedure Evaluation (Signed)
Anesthesia Post Note  Patient: Keyerra Lamere  Procedure(s) Performed: XI ROBOTIC ASSITED PARTIAL NEPHRECTOMY (Left )     Patient location during evaluation: PACU Anesthesia Type: General Level of consciousness: awake and alert Pain management: pain level controlled Vital Signs Assessment: post-procedure vital signs reviewed and stable Respiratory status: spontaneous breathing, nonlabored ventilation, respiratory function stable and patient connected to nasal cannula oxygen Cardiovascular status: blood pressure returned to baseline and stable Postop Assessment: no apparent nausea or vomiting Anesthetic complications: no    Last Vitals:  Vitals:   12/11/17 1300 12/11/17 1419  BP: 130/84 139/74  Pulse: (!) 52 (!) 50  Resp: 13 18  Temp:    SpO2: 100% 100%    Last Pain:  Vitals:   12/11/17 1300  TempSrc:   PainSc: 0-No pain                 Kalai Baca S

## 2017-12-12 ENCOUNTER — Encounter (HOSPITAL_COMMUNITY): Payer: Self-pay | Admitting: Urology

## 2017-12-12 LAB — CBC
HEMATOCRIT: 39.2 % (ref 36.0–46.0)
Hemoglobin: 12.9 g/dL (ref 12.0–15.0)
MCH: 31.9 pg (ref 26.0–34.0)
MCHC: 32.9 g/dL (ref 30.0–36.0)
MCV: 96.8 fL (ref 78.0–100.0)
Platelets: 176 10*3/uL (ref 150–400)
RBC: 4.05 MIL/uL (ref 3.87–5.11)
RDW: 13.9 % (ref 11.5–15.5)
WBC: 9.3 10*3/uL (ref 4.0–10.5)

## 2017-12-12 LAB — BASIC METABOLIC PANEL
Anion gap: 8 (ref 5–15)
BUN: 13 mg/dL (ref 6–20)
CHLORIDE: 107 mmol/L (ref 98–111)
CO2: 25 mmol/L (ref 22–32)
Calcium: 8.4 mg/dL — ABNORMAL LOW (ref 8.9–10.3)
Creatinine, Ser: 0.93 mg/dL (ref 0.44–1.00)
GFR calc non Af Amer: >60 mL/min (ref >60)
Glucose, Bld: 93 mg/dL (ref 70–99)
Potassium: 5.1 mmol/L (ref 3.5–5.1)
SODIUM: 140 mmol/L (ref 135–145)

## 2017-12-12 LAB — HEMOGLOBIN AND HEMATOCRIT, BLOOD
HEMATOCRIT: 37.2 % (ref 36.0–46.0)
HEMOGLOBIN: 12.2 g/dL (ref 12.0–15.0)

## 2017-12-12 NOTE — Progress Notes (Signed)
1 Day Post-Op Subjective: No acute events overnight.  Pain controlled.  Denies nausea/vomiting.  Her catheter was removed this morning, but she has not urinated yet.  She has passed some flatus, but denies a bowel movement.  Overall doing well.  Objective: Vital signs in last 24 hours: Temp:  [97 F (36.1 C)-98.8 F (37.1 C)] 97.8 F (36.6 C) (09/07 0459) Pulse Rate:  [47-77] 58 (09/07 0459) Resp:  [9-18] 18 (09/07 0459) BP: (119-148)/(52-84) 122/65 (09/07 0459) SpO2:  [99 %-100 %] 100 % (09/07 0459)  Intake/Output from previous day: 09/06 0701 - 09/07 0700 In: 3228.2 [I.V.:2728.1; IV Piggyback:500.1] Out: 1725 [Urine:1425; Drains:250; Blood:50]  Intake/Output this shift: No intake/output data recorded.  Physical Exam:  General: Alert and oriented CV: RRR, palpable distal pulses Lungs: CTAB, equal chest rise Abdomen: Soft, NTND, no rebound or guarding.  JP drain with bloody output Incisions: Incisions are clean, dry and intact Ext: NT, No erythema  Lab Results: Recent Labs    12/11/17 1144 12/12/17 0521  HGB 14.5 12.9  HCT 44.2 39.2   BMET Recent Labs    12/11/17 1144 12/12/17 0521  NA 141 140  K 5.0 5.1  CL 108 107  CO2 24 25  GLUCOSE 172* 93  BUN 13 13  CREATININE 0.89 0.93  CALCIUM 8.6* 8.4*     Studies/Results: No results found.  Assessment/Plan: Postop day 1 status post robotic assisted laparoscopic left partial nephrectomy  -Foley was removed this morning -Out of bed to chair and ambulate -Advance diet as tolerated.  DC IV fluids -Continue to monitor JP output.  The plan is to remove it prior to discharge -Possibly home later this afternoon if meeting discharge goals   LOS: 1 day   Ellison Hughs, MD [facility] Pager: [phone]  12/12/2017, 9:16 AM

## 2017-12-12 NOTE — Progress Notes (Signed)
At shift change, patient ambulating in hall with minimal assistance and tolerating well.  This RN questioned dayshift RN regarding strict bedrest order for patient.  Dayshift RN stated attending MD encouraged patient to walk tonight after surgery. Will continue to monitor patient closely. Ellen Klein I

## 2017-12-18 NOTE — Discharge Summary (Signed)
Date of admission: 12/11/2017  Date of discharge: 12/12/2017  Admission diagnosis: 1.5 cm left renal mass  Discharge diagnosis: Same    History and Physical: For full details, please see admission history and physical. Briefly, Ellen Klein is a 47 y.o. year old patient with a 1.5 cm left renal mass.  She underwent robotic assisted laparoscopic left partial nephrectomy with Dr. Louis Meckel on 12/11/2017.  Hospital Course: Postoperatively, the patient was monitored on the floor.  On postop day 1, her lab work was within normal limits, she was voiding without difficulty, tolerating a regular diet, ambulating and her pain was controlled.  Physical Exam:  General: Alert and oriented CV: RRR, palpable distal pulses Lungs: CTAB, equal chest rise Abdomen: Soft, NTND, no rebound or guarding Incisions: Clean, dry and intact.  JP drain removed prior to discharge Ext: NT, No erythema  Laboratory values: No results for input(s): HGB, HCT in the last 72 hours. No results for input(s): CREATININE in the last 72 hours.  Disposition: Home  Discharge instruction: The patient was instructed to be ambulatory but told to refrain from heavy lifting, strenuous activity, or driving.  Discharge medications:  Allergies as of 12/12/2017      Reactions   Flagyl [metronidazole] Nausea And Vomiting   Imdur [isosorbide Dinitrate]    Nausea and vomiting      Medication List    TAKE these medications   acetaminophen 500 MG tablet Commonly known as:  TYLENOL Take 500 mg by mouth every 6 (six) hours as needed for moderate pain.   cyanocobalamin 1000 MCG/ML injection Commonly known as:  (VITAMIN B-12) Inject 1,000 mcg into the muscle every 14 (fourteen) days.   DULoxetine 60 MG capsule Commonly known as:  CYMBALTA Take 1 capsule (60 mg total) by mouth daily.   Suvorexant 10 MG Tabs Take 10 mg by mouth at bedtime.   traMADol 50 MG tablet Commonly known as:  ULTRAM Take 1-2 tablets (50-100 mg total) by mouth  every 6 (six) hours as needed for moderate pain or severe pain.       Followup:  Follow-up Information    Ardis Hughs, MD Follow up on 12/25/2017.   Specialty:  Urology Why:  at 2:15 Contact information: [place] [id number] [phone]

## 2018-01-05 ENCOUNTER — Encounter: Payer: Self-pay | Admitting: Licensed Clinical Social Worker

## 2018-01-05 ENCOUNTER — Telehealth: Payer: Self-pay | Admitting: Licensed Clinical Social Worker

## 2018-01-05 NOTE — Telephone Encounter (Signed)
New genetic counseling referral received from Dr. Louis Meckel at [facility] for renal cell carcinoma. Pt has been scheduled to see Faith Rogue on 10/21 at 2pm. Pt aware to arrive 15 minutes early. Letter mailed.

## 2018-01-25 ENCOUNTER — Inpatient Hospital Stay: Payer: BLUE CROSS/BLUE SHIELD

## 2018-01-25 ENCOUNTER — Inpatient Hospital Stay: Payer: BLUE CROSS/BLUE SHIELD | Admitting: Licensed Clinical Social Worker

## 2018-12-21 ENCOUNTER — Other Ambulatory Visit: Payer: Self-pay

## 2018-12-21 ENCOUNTER — Other Ambulatory Visit (HOSPITAL_COMMUNITY): Payer: Self-pay | Admitting: Urology

## 2018-12-21 ENCOUNTER — Ambulatory Visit (HOSPITAL_COMMUNITY)
Admission: RE | Admit: 2018-12-21 | Discharge: 2018-12-21 | Disposition: A | Discharge status: Home / Self Care | Payer: BC Managed Care – PPO | Source: Ambulatory Visit | Attending: Urology | Admitting: Urology

## 2018-12-21 DIAGNOSIS — C642 Malignant neoplasm of left kidney, except renal pelvis: Secondary | ICD-10-CM

## 2019-12-05 ENCOUNTER — Other Ambulatory Visit: Payer: Self-pay | Admitting: Gastroenterology

## 2019-12-05 DIAGNOSIS — R112 Nausea with vomiting, unspecified: Secondary | ICD-10-CM

## 2019-12-09 ENCOUNTER — Ambulatory Visit
Admission: RE | Admit: 2019-12-09 | Discharge: 2019-12-09 | Disposition: A | Discharge status: Home / Self Care | Payer: BC Managed Care – PPO | Source: Ambulatory Visit | Attending: Gastroenterology | Admitting: Gastroenterology

## 2019-12-09 DIAGNOSIS — R112 Nausea with vomiting, unspecified: Secondary | ICD-10-CM

## 2019-12-26 ENCOUNTER — Ambulatory Visit (HOSPITAL_COMMUNITY)
Admission: RE | Admit: 2019-12-26 | Discharge: 2019-12-26 | Disposition: A | Discharge status: Home / Self Care | Payer: BC Managed Care – PPO | Source: Ambulatory Visit | Attending: Urology | Admitting: Urology

## 2019-12-26 ENCOUNTER — Other Ambulatory Visit (HOSPITAL_COMMUNITY): Payer: Self-pay | Admitting: Urology

## 2019-12-26 ENCOUNTER — Other Ambulatory Visit: Payer: Self-pay

## 2019-12-26 DIAGNOSIS — C642 Malignant neoplasm of left kidney, except renal pelvis: Secondary | ICD-10-CM

## 2020-01-06 ENCOUNTER — Other Ambulatory Visit (HOSPITAL_COMMUNITY): Payer: Self-pay | Admitting: Urology

## 2020-01-06 DIAGNOSIS — C641 Malignant neoplasm of right kidney, except renal pelvis: Secondary | ICD-10-CM

## 2020-01-06 DIAGNOSIS — C642 Malignant neoplasm of left kidney, except renal pelvis: Secondary | ICD-10-CM

## 2020-01-16 ENCOUNTER — Encounter (HOSPITAL_COMMUNITY): Payer: Self-pay

## 2020-01-16 ENCOUNTER — Ambulatory Visit (HOSPITAL_COMMUNITY)
Admission: RE | Admit: 2020-01-16 | Discharge: 2020-01-16 | Disposition: A | Discharge status: Home / Self Care | Payer: BC Managed Care – PPO | Source: Ambulatory Visit | Attending: Urology | Admitting: Urology

## 2020-01-16 ENCOUNTER — Other Ambulatory Visit: Payer: Self-pay

## 2020-01-16 DIAGNOSIS — C642 Malignant neoplasm of left kidney, except renal pelvis: Secondary | ICD-10-CM

## 2020-01-16 DIAGNOSIS — C641 Malignant neoplasm of right kidney, except renal pelvis: Secondary | ICD-10-CM

## 2020-01-16 MED ORDER — GADOBUTROL 1 MMOL/ML IV SOLN: 10.0000 mL | Freq: Once | INTRAVENOUS | Status: DC | PRN

## 2020-01-16 NOTE — Progress Notes (Signed)
Patient arrive for her MRi Abd wo/w , after screening the patient she stated she has had an Iron Infusion last month. Per new MRI Protocol, patient will need to verify with MD that provided the infusion that it was Feraheme. If so, she is to [facility] for 3 months post infusion. If it was not Artemio Aly, she will RS to return this week.

## 2020-03-26 ENCOUNTER — Ambulatory Visit (HOSPITAL_COMMUNITY)
Admission: RE | Admit: 2020-03-26 | Discharge: 2020-03-26 | Disposition: A | Discharge status: Home / Self Care | Payer: Self-pay | Source: Ambulatory Visit | Attending: Urology | Admitting: Urology

## 2020-03-26 ENCOUNTER — Encounter (HOSPITAL_COMMUNITY): Payer: Self-pay

## 2020-03-26 NOTE — Progress Notes (Signed)
Patient was a no call no show for her MRi scheduled today. Order will be placed back into Ancillary orders.

## 2020-07-23 ENCOUNTER — Ambulatory Visit (HOSPITAL_COMMUNITY)
Admission: RE | Admit: 2020-07-23 | Discharge: 2020-07-23 | Disposition: A | Discharge status: Home / Self Care | Payer: 59 | Source: Ambulatory Visit | Attending: Urology | Admitting: Urology

## 2020-07-23 ENCOUNTER — Other Ambulatory Visit: Payer: Self-pay

## 2020-07-23 ENCOUNTER — Other Ambulatory Visit (HOSPITAL_COMMUNITY): Payer: Self-pay | Admitting: Urology

## 2020-07-23 DIAGNOSIS — C641 Malignant neoplasm of right kidney, except renal pelvis: Secondary | ICD-10-CM

## 2020-07-23 DIAGNOSIS — C642 Malignant neoplasm of left kidney, except renal pelvis: Secondary | ICD-10-CM | POA: Diagnosis present

## 2020-11-20 ENCOUNTER — Other Ambulatory Visit (HOSPITAL_COMMUNITY): Payer: Self-pay | Admitting: Urology

## 2020-11-20 ENCOUNTER — Other Ambulatory Visit: Payer: Self-pay | Admitting: Urology

## 2020-11-20 DIAGNOSIS — C641 Malignant neoplasm of right kidney, except renal pelvis: Secondary | ICD-10-CM

## 2020-11-20 DIAGNOSIS — C652 Malignant neoplasm of left renal pelvis: Secondary | ICD-10-CM

## 2020-12-06 ENCOUNTER — Other Ambulatory Visit: Payer: Self-pay

## 2020-12-06 ENCOUNTER — Ambulatory Visit (HOSPITAL_COMMUNITY)
Admission: RE | Admit: 2020-12-06 | Discharge: 2020-12-06 | Disposition: A | Discharge status: Home / Self Care | Payer: 59 | Source: Ambulatory Visit | Attending: Urology | Admitting: Urology

## 2020-12-06 DIAGNOSIS — C641 Malignant neoplasm of right kidney, except renal pelvis: Secondary | ICD-10-CM | POA: Diagnosis present

## 2020-12-06 DIAGNOSIS — C652 Malignant neoplasm of left renal pelvis: Secondary | ICD-10-CM | POA: Diagnosis not present

## 2020-12-06 MED ORDER — GADOBUTROL 1 MMOL/ML IV SOLN
10.0000 mL | Freq: Once | INTRAVENOUS | Status: AC | PRN
  Administered 2020-12-06: 10 mL via INTRAVENOUS

## 2020-12-25 IMAGING — CR DG CHEST 2V
2 series · 2 of 2 positions shown · non-contrast
Comparison: December 26, 2019.

CLINICAL DATA: Left renal cell carcinoma.

EXAM:
CHEST - 2 VIEW

[w chest pa]
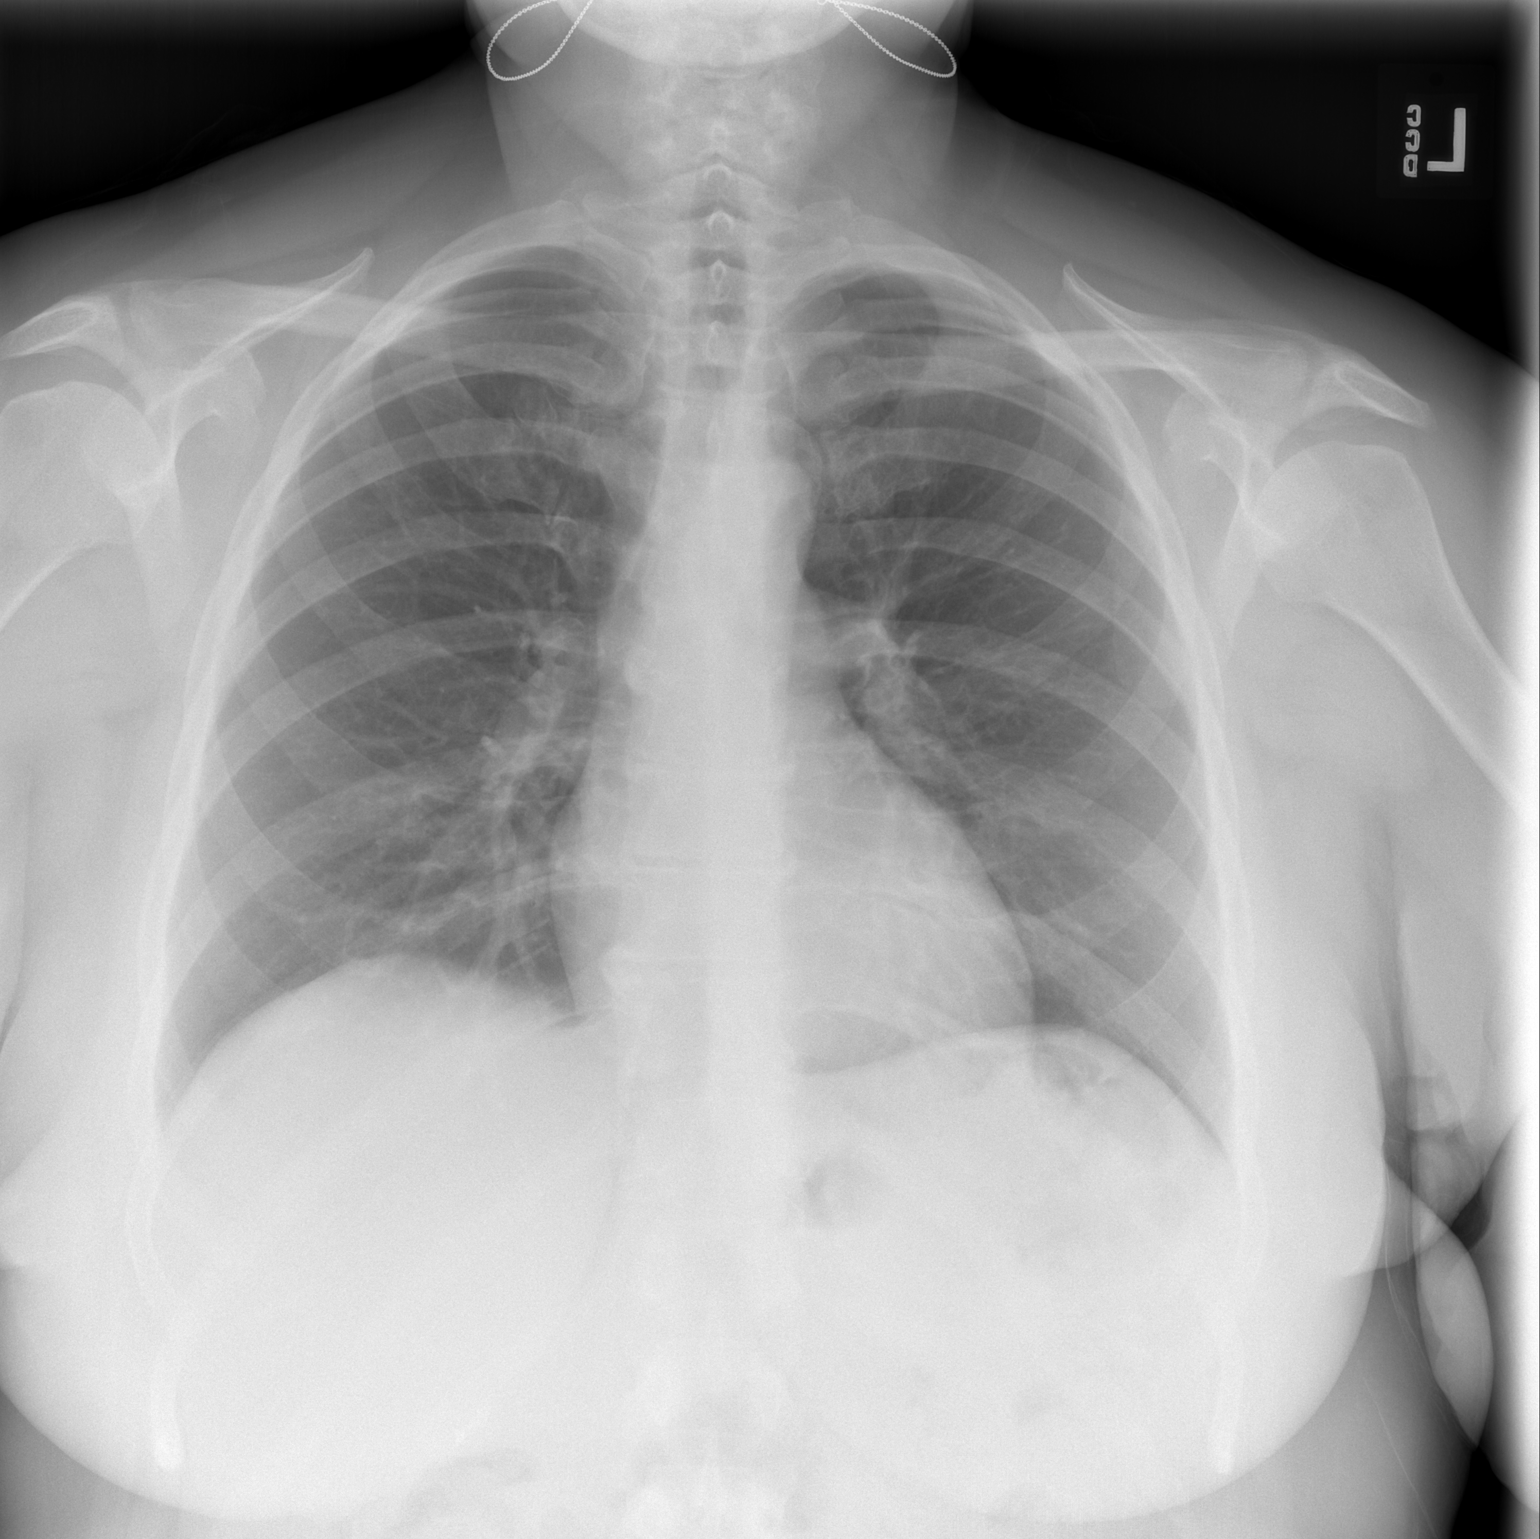

[w chest lat]
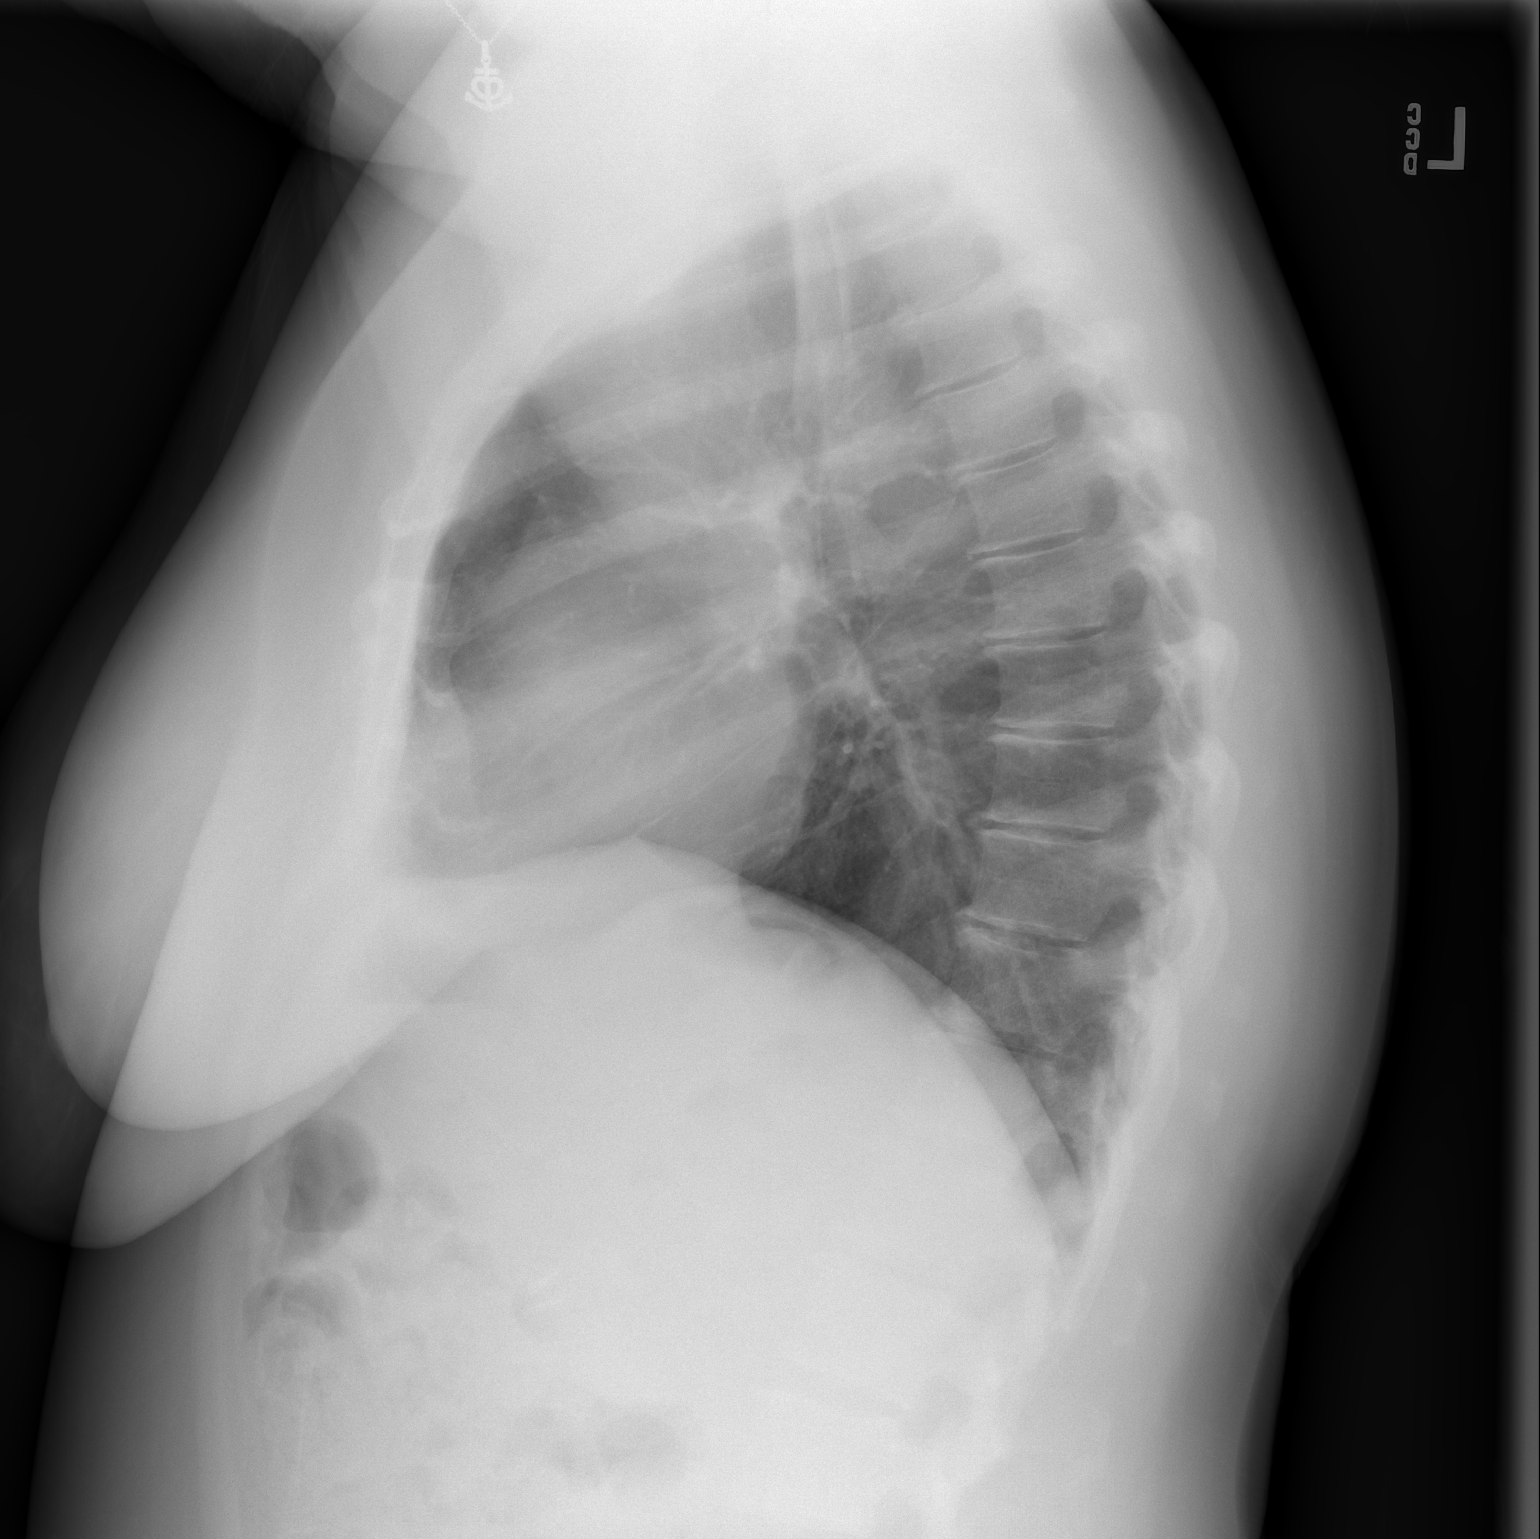

[2 of 2 positions shown; findings below may reference images not displayed]

FINDINGS: The heart size and mediastinal contours are within normal limits.
Both lungs are clear. The visualized skeletal structures are
unremarkable.
IMPRESSION: No active cardiopulmonary disease.

## 2021-12-20 ENCOUNTER — Ambulatory Visit (HOSPITAL_COMMUNITY)
Admission: RE | Admit: 2021-12-20 | Discharge: 2021-12-20 | Disposition: A | Discharge status: Home / Self Care | Payer: BC Managed Care – PPO | Source: Ambulatory Visit | Attending: Urology | Admitting: Urology

## 2021-12-20 ENCOUNTER — Other Ambulatory Visit (HOSPITAL_COMMUNITY): Payer: Self-pay | Admitting: Urology

## 2021-12-20 DIAGNOSIS — C642 Malignant neoplasm of left kidney, except renal pelvis: Secondary | ICD-10-CM

## 2022-02-15 ENCOUNTER — Encounter (INDEPENDENT_AMBULATORY_CARE_PROVIDER_SITE_OTHER): Payer: Self-pay | Admitting: Gastroenterology

## 2022-10-13 ENCOUNTER — Other Ambulatory Visit: Payer: Self-pay | Admitting: *Deleted

## 2022-10-13 DIAGNOSIS — M7989 Other specified soft tissue disorders: Secondary | ICD-10-CM

## 2022-10-28 ENCOUNTER — Ambulatory Visit (HOSPITAL_COMMUNITY)
Admission: RE | Admit: 2022-10-28 | Discharge: 2022-10-28 | Disposition: A | Discharge status: Home / Self Care | Payer: Managed Care, Other (non HMO) | Source: Ambulatory Visit | Attending: Vascular Surgery | Admitting: Vascular Surgery

## 2022-10-28 ENCOUNTER — Ambulatory Visit (INDEPENDENT_AMBULATORY_CARE_PROVIDER_SITE_OTHER): Payer: Managed Care, Other (non HMO)

## 2022-10-28 VITALS — BP 143/98 | HR 67 | Temp 98.2°F | Resp 18 | Ht 64.0 in | Wt 276.2 lb

## 2022-10-28 DIAGNOSIS — M7989 Other specified soft tissue disorders: Secondary | ICD-10-CM | POA: Insufficient documentation

## 2022-10-28 DIAGNOSIS — I872 Venous insufficiency (chronic) (peripheral): Secondary | ICD-10-CM

## 2022-10-28 NOTE — Progress Notes (Signed)
Requested by:  Tacy Learn, FNP [address] [place],  [place] [address]  Reason for consultation: BLE edema    History of Present Illness   Ellen Klein is a 52 y.o. (02-27-1971) female who presents for evaluation of bilateral lower extremity swelling that started in May. She says it just appeared suddenly. She reports that her swelling is better upon first waking but that as day goes on it increases. She has had bilateral leg itching that started after having some extensive swelling. This is associated also with some darkening in the area of itching.  She otherwise denies any aching, throbbing, burning, stinging, bleeding or ulceration. She does elevate some with a little improvement. She has not worn compression stockings before. 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 She does not exercise but she says she does have a pool so she swims frequently. She has history of gastric bypass but has struggled with regaining of weight. She also has history of Renal cell carcinoma but she says as far as she knows her kidneys are functioning well. She did have an echo that showed some aortic insufficiency however she does not currently see a cardiologist. She is scheduled to have an appointment with one in August. She does not have any history of DVT  or venous disease.   Venous symptoms include: Swelling BLE, itching Onset/duration:  2 months  Occupation:  works office job Aggravating factors: sitting, standing Alleviating factors: elevation Compression:  no Helps:  n/a Pain medications:  Tylenol Previous vein procedures:  No History of DVT:  No  Past Medical History:  Diagnosis Date   Anemia    hx of iron transfusions   Anxiety    Cancer (HCC)    right kidney - 2017    Depression    Family history of adverse reaction to anesthesia    father- N/V    GERD without esophagitis    Headache    migraines-"usually around menses"   Heart  murmur    at birth    PONV (postoperative nausea and vomiting)    Right kidney mass    surgery planned 02-13-16    Past Surgical History:  Procedure Laterality Date   CHOLECYSTECTOMY     laparoscopic   GASTRIC ROUX-EN-Y     2016 - [facility] (130# loss)- weight has steadied now.   ROBOTIC ASSITED PARTIAL NEPHRECTOMY Right 02/13/2016   Procedure: XI ROBOTIC ASSITED PARTIAL NEPHRECTOMY;  Surgeon: Crist Fat, MD;  Location: [facility];  Service: Urology;  Laterality: Right;   ROBOTIC ASSITED PARTIAL NEPHRECTOMY Left 12/11/2017   Procedure: XI ROBOTIC ASSITED PARTIAL NEPHRECTOMY;  Surgeon: Crist Fat, MD;  Location: [facility];  Service: Urology;  Laterality: Left;   rt kidney mass     TONSILLECTOMY     UVULECTOMY      Social History   Socioeconomic History   Marital status: Single    Spouse name: Not on file   Number of children: Not on file   Years of education: Not on file   Highest education level: Not on file  Occupational History   Not on file  Tobacco Use   Smoking status: Never   Smokeless tobacco: Never  Vaping Use   Vaping status: Never Used  Substance and Sexual Activity   Alcohol use: Yes    Comment: rare social    Drug use: No   Sexual activity: Not Currently  Other  Topics Concern   Not on file  Social History Narrative   Not on file   Social Determinants of Health   Financial Resource Strain: Not on file  Food Insecurity: Not on file  Transportation Needs: Not on file  Physical Activity: Not on file  Stress: Not on file  Social Connections: Not on file  Intimate Partner Violence: Not on file   No family history on file.  Current Outpatient Medications  Medication Sig Dispense Refill   acetaminophen (TYLENOL) 500 MG tablet Take 500 mg by mouth every 6 (six) hours as needed for moderate pain.     cyanocobalamin (,VITAMIN B-12,) 1000 MCG/ML injection Inject 1,000 mcg into the muscle every 14 (fourteen) days.  2   DULoxetine (CYMBALTA) 60 MG  capsule Take 1 capsule (60 mg total) by mouth daily. 30 capsule 1   Suvorexant (BELSOMRA) 10 MG TABS Take 10 mg by mouth at bedtime. (Patient not taking: Reported on 11/26/2017) 30 tablet 0   traMADol (ULTRAM) 50 MG tablet Take 1-2 tablets (50-100 mg total) by mouth every 6 (six) hours as needed for moderate pain or severe pain. (Patient not taking: Reported on 10/28/2022) 30 tablet 0   No current facility-administered medications for this visit.    Allergies  Allergen Reactions   Flagyl [Metronidazole] Nausea And Vomiting   Imdur [Isosorbide Dinitrate]     Nausea and vomiting    REVIEW OF SYSTEMS (negative unless checked):   Cardiac:  []  Chest pain or chest pressure? []  Shortness of breath upon activity? []  Shortness of breath when lying flat? []  Irregular heart rhythm?  Vascular:  []  Pain in calf, thigh, or hip brought on by walking? []  Pain in feet at night that wakes you up from your sleep? []  Blood clot in your veins? [x]  Leg swelling?  Pulmonary:  []  Oxygen at home? []  Productive cough? []  Wheezing?  Neurologic:  []  Sudden weakness in arms or legs? []  Sudden numbness in arms or legs? []  Sudden onset of difficult speaking or slurred speech? []  Temporary loss of vision in one eye? []  Problems with dizziness?  Gastrointestinal:  []  Blood in stool? []  Vomited blood?  Genitourinary:  []  Burning when urinating? []  Blood in urine?  Psychiatric:  []  Major depression  Hematologic:  []  Bleeding problems? []  Problems with blood clotting?  Dermatologic:  []  Rashes or ulcers?  Constitutional:  []  Fever or chills?  Ear/Nose/Throat:  []  Change in hearing? []  Nose bleeds? []  Sore throat?  Musculoskeletal:  []  Back pain? []  Joint pain? []  Muscle pain?   Physical Examination     Vitals:   10/28/22 1143  BP: (!) 143/98  Pulse: 67  Resp: 18  Temp: 98.2 F (36.8 C)  TempSrc: Temporal  SpO2: 98%  Weight: 276 lb 3.2 oz (125.3 kg)  Height: 5\' 4"  (1.626  m)   Body mass index is 47.41 kg/m.  General:  Morbidly obese female in NAD; vital signs documented above Gait: Normal HENT: WNL, normocephalic Pulmonary: normal non-labored breathing , without wheezing Cardiac: regular  Abdomen: soft Vascular Exam/Pulses: Extremities: without varicose veins, without reticular veins, with edema, with  mild stasis pigmentation changes on bilateral anterior distal legs, some venous dermatitis, without lipodermatosclerosis, without ulcers Musculoskeletal: no muscle wasting or atrophy  Neurologic: A&O X 3;  No focal weakness or paresthesias are detected Psychiatric:  The pt has Normal affect.  Non-invasive Vascular Imaging   BLE Venous Insufficiency Duplex (10/28/22):  LLE: No DVT and SVT GSV reflux SFJ  and proximal thigh GSV diameter 0.33-0.42 No SSV reflux No deep venous reflux   Medical Decision Making   Ellen Klein is a 52 y.o. female who presents with: LLE chronic venous insufficiency with edema. Her duplex today shows no DVT or SVT. She does not have any deep or SSV reflux. Her GSV is incompetent at the [facility] and proximal thigh however it is too small to be considered for lazer ablation. I think she may have component of lymphedema in her legs as well. I suspect her edema is multifactorial. She certainly will benefit from Cardiology evaluation and continued follow up regarding her Renal function with her history of renal cell Carcinoma Based on the patient's history and examination, I recommend: knee high 15-20 mmHg compression stockings, daily elevation above level of heart 20-30 minutes, exercise, weight reduction, and refraining from prolonged sitting or standing. Recommend hydrocortisone for her dermatitis on BLE She was measured for knee high stockings at today's visit and purchased a pair today She can follow up with [place] as needed if she has new or concerning symptoms   Ellen Congress, PA-C [facility]  [place] Office: [phone]  10/28/2022, 11:50 AM  Clinic MD: Steve Rattler

## 2024-01-20 ENCOUNTER — Encounter (INDEPENDENT_AMBULATORY_CARE_PROVIDER_SITE_OTHER): Payer: Self-pay | Admitting: Gastroenterology
